# Patient Record
Sex: Male | Born: 1950 | Race: White | Hispanic: No | Smoking: Never smoker
Health system: Southern US, Community
[De-identification: ages and names within clinical notes are randomized; demographics above are authoritative.]

## PROBLEM LIST (undated history)

## (undated) DIAGNOSIS — F32A Depression, unspecified: Secondary | ICD-10-CM

## (undated) DIAGNOSIS — F329 Major depressive disorder, single episode, unspecified: Secondary | ICD-10-CM

## (undated) DIAGNOSIS — E7800 Pure hypercholesterolemia, unspecified: Secondary | ICD-10-CM

## (undated) DIAGNOSIS — I10 Essential (primary) hypertension: Secondary | ICD-10-CM

## (undated) DIAGNOSIS — N400 Enlarged prostate without lower urinary tract symptoms: Secondary | ICD-10-CM

## (undated) DIAGNOSIS — G4730 Sleep apnea, unspecified: Secondary | ICD-10-CM

## (undated) DIAGNOSIS — I480 Paroxysmal atrial fibrillation: Secondary | ICD-10-CM

## (undated) DIAGNOSIS — J45909 Unspecified asthma, uncomplicated: Secondary | ICD-10-CM

## (undated) DIAGNOSIS — Z9884 Bariatric surgery status: Secondary | ICD-10-CM

## (undated) DIAGNOSIS — J449 Chronic obstructive pulmonary disease, unspecified: Secondary | ICD-10-CM

## (undated) DIAGNOSIS — F341 Dysthymic disorder: Secondary | ICD-10-CM

## (undated) DIAGNOSIS — I890 Lymphedema, not elsewhere classified: Secondary | ICD-10-CM

## (undated) DIAGNOSIS — M21371 Foot drop, right foot: Secondary | ICD-10-CM

## (undated) DIAGNOSIS — I509 Heart failure, unspecified: Secondary | ICD-10-CM

## (undated) DIAGNOSIS — Z8659 Personal history of other mental and behavioral disorders: Secondary | ICD-10-CM

## (undated) HISTORY — DX: Lymphedema, not elsewhere classified: I89.0

## (undated) HISTORY — DX: Major depressive disorder, single episode, unspecified: F32.9

## (undated) HISTORY — PX: ROUX-EN-Y GASTRIC BYPASS: SHX1104

## (undated) HISTORY — DX: Dysthymic disorder: F34.1

## (undated) HISTORY — DX: Pure hypercholesterolemia, unspecified: E78.00

## (undated) HISTORY — DX: Sleep apnea, unspecified: G47.30

## (undated) HISTORY — DX: Depression, unspecified: F32.A

## (undated) HISTORY — PX: OTHER SURGICAL HISTORY: SHX169

## (undated) HISTORY — DX: Personal history of other mental and behavioral disorders: Z86.59

---

## 2000-04-02 ENCOUNTER — Inpatient Hospital Stay (HOSPITAL_COMMUNITY): Admission: RE | Admit: 2000-04-02 | Discharge: 2000-04-03 | Payer: Self-pay | Admitting: Otolaryngology

## 2009-03-01 DIAGNOSIS — G4733 Obstructive sleep apnea (adult) (pediatric): Secondary | ICD-10-CM | POA: Insufficient documentation

## 2010-05-26 DIAGNOSIS — Z9884 Bariatric surgery status: Secondary | ICD-10-CM

## 2010-05-26 HISTORY — DX: Bariatric surgery status: Z98.84

## 2013-04-01 DIAGNOSIS — M791 Myalgia, unspecified site: Secondary | ICD-10-CM | POA: Insufficient documentation

## 2013-06-14 DIAGNOSIS — M129 Arthropathy, unspecified: Secondary | ICD-10-CM | POA: Insufficient documentation

## 2013-06-14 DIAGNOSIS — M5416 Radiculopathy, lumbar region: Secondary | ICD-10-CM | POA: Insufficient documentation

## 2014-11-22 ENCOUNTER — Inpatient Hospital Stay (HOSPITAL_COMMUNITY)
Admission: AD | Admit: 2014-11-22 | Discharge: 2014-11-25 | DRG: 444 | Disposition: A | Discharge status: Home / Self Care | Payer: Medicare HMO | Source: Other Acute Inpatient Hospital | Attending: Internal Medicine | Admitting: Internal Medicine

## 2014-11-22 ENCOUNTER — Encounter (HOSPITAL_COMMUNITY): Payer: Self-pay | Admitting: General Practice

## 2014-11-22 DIAGNOSIS — K8050 Calculus of bile duct without cholangitis or cholecystitis without obstruction: Secondary | ICD-10-CM | POA: Diagnosis present

## 2014-11-22 DIAGNOSIS — M549 Dorsalgia, unspecified: Secondary | ICD-10-CM | POA: Diagnosis not present

## 2014-11-22 DIAGNOSIS — G8929 Other chronic pain: Secondary | ICD-10-CM | POA: Diagnosis present

## 2014-11-22 DIAGNOSIS — J45909 Unspecified asthma, uncomplicated: Secondary | ICD-10-CM | POA: Diagnosis not present

## 2014-11-22 DIAGNOSIS — G43909 Migraine, unspecified, not intractable, without status migrainosus: Secondary | ICD-10-CM | POA: Diagnosis not present

## 2014-11-22 DIAGNOSIS — Z9884 Bariatric surgery status: Secondary | ICD-10-CM | POA: Diagnosis not present

## 2014-11-22 DIAGNOSIS — Z87891 Personal history of nicotine dependence: Secondary | ICD-10-CM

## 2014-11-22 DIAGNOSIS — I480 Paroxysmal atrial fibrillation: Secondary | ICD-10-CM | POA: Diagnosis not present

## 2014-11-22 DIAGNOSIS — K859 Acute pancreatitis without necrosis or infection, unspecified: Secondary | ICD-10-CM

## 2014-11-22 DIAGNOSIS — F32A Depression, unspecified: Secondary | ICD-10-CM

## 2014-11-22 DIAGNOSIS — R296 Repeated falls: Secondary | ICD-10-CM | POA: Diagnosis not present

## 2014-11-22 DIAGNOSIS — N400 Enlarged prostate without lower urinary tract symptoms: Secondary | ICD-10-CM | POA: Diagnosis not present

## 2014-11-22 DIAGNOSIS — K851 Biliary acute pancreatitis: Secondary | ICD-10-CM | POA: Diagnosis not present

## 2014-11-22 DIAGNOSIS — Z6841 Body Mass Index (BMI) 40.0 and over, adult: Secondary | ICD-10-CM | POA: Diagnosis not present

## 2014-11-22 DIAGNOSIS — F329 Major depressive disorder, single episode, unspecified: Secondary | ICD-10-CM | POA: Diagnosis present

## 2014-11-22 DIAGNOSIS — I252 Old myocardial infarction: Secondary | ICD-10-CM | POA: Diagnosis not present

## 2014-11-22 DIAGNOSIS — E876 Hypokalemia: Secondary | ICD-10-CM | POA: Diagnosis present

## 2014-11-22 DIAGNOSIS — I10 Essential (primary) hypertension: Secondary | ICD-10-CM | POA: Diagnosis present

## 2014-11-22 DIAGNOSIS — R109 Unspecified abdominal pain: Secondary | ICD-10-CM | POA: Diagnosis present

## 2014-11-22 DIAGNOSIS — F4310 Post-traumatic stress disorder, unspecified: Secondary | ICD-10-CM | POA: Diagnosis not present

## 2014-11-22 DIAGNOSIS — D72829 Elevated white blood cell count, unspecified: Secondary | ICD-10-CM | POA: Diagnosis not present

## 2014-11-22 DIAGNOSIS — M21371 Foot drop, right foot: Secondary | ICD-10-CM

## 2014-11-22 HISTORY — DX: Foot drop, right foot: M21.371

## 2014-11-22 HISTORY — DX: Bariatric surgery status: Z98.84

## 2014-11-22 HISTORY — DX: Depression, unspecified: F32.A

## 2014-11-22 HISTORY — DX: Paroxysmal atrial fibrillation: I48.0

## 2014-11-22 HISTORY — DX: Unspecified asthma, uncomplicated: J45.909

## 2014-11-22 HISTORY — DX: Benign prostatic hyperplasia without lower urinary tract symptoms: N40.0

## 2014-11-22 HISTORY — DX: Major depressive disorder, single episode, unspecified: F32.9

## 2014-11-22 LAB — CBC
HEMATOCRIT: 40.7 % (ref 39.0–52.0)
Hemoglobin: 13.2 g/dL (ref 13.0–17.0)
MCH: 28.6 pg (ref 26.0–34.0)
MCHC: 32.4 g/dL (ref 30.0–36.0)
MCV: 88.3 fL (ref 78.0–100.0)
PLATELETS: 198 10*3/uL (ref 150–400)
RBC: 4.61 MIL/uL (ref 4.22–5.81)
RDW: 14.9 % (ref 11.5–15.5)
WBC: 12.5 10*3/uL — AB (ref 4.0–10.5)

## 2014-11-22 LAB — CREATININE, SERUM
CREATININE: 0.66 mg/dL (ref 0.61–1.24)
GFR calc non Af Amer: >60 mL/min (ref >60)

## 2014-11-22 MED ORDER — BUDESONIDE-FORMOTEROL FUMARATE 160-4.5 MCG/ACT IN AERO: 2.0000 | INHALATION_SPRAY | Freq: Two times a day (BID) | RESPIRATORY_TRACT | Status: DC | PRN

## 2014-11-22 MED ORDER — ESCITALOPRAM OXALATE 10 MG PO TABS
10.0000 mg | ORAL_TABLET | Freq: Three times a day (TID) | ORAL | Status: DC
  Administered 2014-11-22 – 2014-11-25 (×9): 10 mg via ORAL
  Filled 2014-11-22 (×9): qty 1

## 2014-11-22 MED ORDER — TIZANIDINE HCL 4 MG PO TABS
4.0000 mg | ORAL_TABLET | Freq: Four times a day (QID) | ORAL | Status: DC | PRN
  Administered 2014-11-22: 4 mg via ORAL
  Administered 2014-11-23: 8 mg via ORAL
  Administered 2014-11-24: 4 mg via ORAL
  Filled 2014-11-22 (×5): qty 2

## 2014-11-22 MED ORDER — ACETAMINOPHEN 325 MG PO TABS: 650.0000 mg | ORAL_TABLET | Freq: Four times a day (QID) | ORAL | Status: DC | PRN

## 2014-11-22 MED ORDER — DOXAZOSIN MESYLATE 2 MG PO TABS
2.0000 mg | ORAL_TABLET | Freq: Every day | ORAL | Status: DC
  Administered 2014-11-23 – 2014-11-25 (×3): 2 mg via ORAL
  Filled 2014-11-22 (×3): qty 1

## 2014-11-22 MED ORDER — DICYCLOMINE HCL 20 MG PO TABS
20.0000 mg | ORAL_TABLET | Freq: Three times a day (TID) | ORAL | Status: DC
  Administered 2014-11-22 – 2014-11-25 (×11): 20 mg via ORAL
  Filled 2014-11-22 (×17): qty 1

## 2014-11-22 MED ORDER — FLUTICASONE PROPIONATE 50 MCG/ACT NA SUSP: 1.0000 | Freq: Every day | NASAL | Status: DC | PRN

## 2014-11-22 MED ORDER — MORPHINE SULFATE 2 MG/ML IJ SOLN
2.0000 mg | INTRAMUSCULAR | Status: DC | PRN
  Administered 2014-11-22 – 2014-11-24 (×7): 2 mg via INTRAVENOUS
  Filled 2014-11-22 (×7): qty 1

## 2014-11-22 MED ORDER — POTASSIUM CHLORIDE CRYS ER 20 MEQ PO TBCR
40.0000 meq | EXTENDED_RELEASE_TABLET | Freq: Every day | ORAL | Status: DC
  Administered 2014-11-23 – 2014-11-25 (×3): 40 meq via ORAL
  Filled 2014-11-22 (×3): qty 2

## 2014-11-22 MED ORDER — HEPARIN SODIUM (PORCINE) 5000 UNIT/ML IJ SOLN
5000.0000 [IU] | Freq: Three times a day (TID) | INTRAMUSCULAR | Status: DC
  Administered 2014-11-22 – 2014-11-25 (×6): 5000 [IU] via SUBCUTANEOUS
  Filled 2014-11-22 (×7): qty 1

## 2014-11-22 MED ORDER — ONDANSETRON HCL 4 MG/2ML IJ SOLN: 4.0000 mg | Freq: Four times a day (QID) | INTRAMUSCULAR | Status: DC | PRN

## 2014-11-22 MED ORDER — BUPROPION HCL ER (XL) 300 MG PO TB24: 300.0000 mg | ORAL_TABLET | Freq: Every day | ORAL | Status: DC

## 2014-11-22 MED ORDER — LISINOPRIL 20 MG PO TABS
40.0000 mg | ORAL_TABLET | Freq: Every day | ORAL | Status: DC
  Administered 2014-11-22 – 2014-11-25 (×4): 40 mg via ORAL
  Filled 2014-11-22 (×4): qty 2

## 2014-11-22 MED ORDER — BUPROPION HCL ER (XL) 150 MG PO TB24: 150.0000 mg | ORAL_TABLET | Freq: Every day | ORAL | Status: DC

## 2014-11-22 MED ORDER — AMITRIPTYLINE HCL 25 MG PO TABS
25.0000 mg | ORAL_TABLET | Freq: Three times a day (TID) | ORAL | Status: DC
  Administered 2014-11-22 – 2014-11-25 (×9): 25 mg via ORAL
  Filled 2014-11-22 (×9): qty 1

## 2014-11-22 MED ORDER — BUPROPION HCL ER (XL) 300 MG PO TB24
450.0000 mg | ORAL_TABLET | Freq: Every day | ORAL | Status: DC
  Administered 2014-11-22 – 2014-11-23 (×2): 450 mg via ORAL
  Filled 2014-11-22 (×6): qty 1

## 2014-11-22 MED ORDER — POTASSIUM CHLORIDE CRYS ER 20 MEQ PO TBCR
40.0000 meq | EXTENDED_RELEASE_TABLET | Freq: Once | ORAL | Status: AC
  Administered 2014-11-22: 40 meq via ORAL
  Filled 2014-11-22: qty 2

## 2014-11-22 MED ORDER — ACETAMINOPHEN 650 MG RE SUPP: 650.0000 mg | Freq: Four times a day (QID) | RECTAL | Status: DC | PRN

## 2014-11-22 MED ORDER — ALUM & MAG HYDROXIDE-SIMETH 200-200-20 MG/5ML PO SUSP: 30.0000 mL | Freq: Four times a day (QID) | ORAL | Status: DC | PRN

## 2014-11-22 MED ORDER — DOCUSATE SODIUM 100 MG PO CAPS
100.0000 mg | ORAL_CAPSULE | Freq: Two times a day (BID) | ORAL | Status: DC
  Administered 2014-11-22 – 2014-11-25 (×6): 100 mg via ORAL
  Filled 2014-11-22 (×6): qty 1

## 2014-11-22 MED ORDER — ONDANSETRON HCL 4 MG PO TABS: 4.0000 mg | ORAL_TABLET | Freq: Four times a day (QID) | ORAL | Status: DC | PRN

## 2014-11-22 MED ORDER — SODIUM CHLORIDE 0.9 % IV SOLN
INTRAVENOUS | Status: DC
Start: 2014-11-22 — End: 2014-11-25
  Administered 2014-11-22 – 2014-11-25 (×8): via INTRAVENOUS

## 2014-11-22 NOTE — Progress Notes (Signed)
Dr. Butler Denmarkizwan paged that patient has arrive to floor

## 2014-11-22 NOTE — Consult Note (Signed)
Reason for Consult:  Gallstone pancreatitis Referring Physician: Dr. Alease Medina  Brett Cline is an 64 y.o. male.  HPI: 64 y/o seen in [place] with extensive medical history.  He started having pain mid epigastric area yesterday at 10 PM, thhought it was GERD.  It got worse thru the evening and was taken to the ED in [place] this AM.  He  Was  found to have gallstone pancreatitis.  CT scan shows dilated gallbladder, some biliary dilation and stones in the CBD. The CT scan did not show any pancreatic swelling or edema.   Lipase 989, LFT's are elevated with bilirubin 3.5. Alk Phos 608, ALT 115, AST 123  WBC is 14.5 with left shift.  Because of  His Roux -en Y bypass he was referred go [place] and [facility].  Past Medical History  Diagnosis Date  Depression/PTSD     Chronic back pain/2 surgeries   Morbid obesity  BMI 66.5 down after Roux to 41.5   Hx of polysubstance use, quit 24 years ago   Hx of MI 2002 at [facility] per pt report   Paroxysmal a-fib history  -  Currently in SR   Sleep apnea, on CPAP 15 years, better after surgery and weight loss 2012  History of Roux-en-Y gastric bypass 2012 [place]   Migraines  About 2 per week   Hypertension   Asthma   Hx of Polysubstance abuse, none for 24 years   Right foot drop with multiple falls     Past Surgical History  Procedure Laterality Date  . Ent surgery      for sleep apnea  . 2 lumbar spine sugeries    . Roux-en-y gastric bypass  2002    Family History  Problem Relation Age of Onset  . Throat cancer Mother     Social History:  reports that he has never smoked. He does not have any smokeless tobacco history on file. He reports that he does not drink alcohol or use illicit drugs. Tobacco use:  9 years, quit at age 44 ETOH:  Heavy use:  Quit 24 years ago Drugs:  Cocaine, MJ/Hash Widowed, lives with son 45 and daughter 94 Unemployed since age 30 with back and depression/PTSD issues.   Allergies: No Known  Allergies  Medications:  Prior to Admission:  Prescriptions prior to admission  Medication Sig Dispense Refill Last Dose  . amitriptyline (ELAVIL) 25 MG tablet Take 25 mg by mouth 3 (three) times daily.    11/21/2014 at Unknown time  . aspirin-acetaminophen-caffeine (EXCEDRIN MIGRAINE) 250-250-65 MG per tablet Take 2 tablets by mouth every 6 (six) hours as needed for migraine.   Past Week at Unknown time  . buPROPion (WELLBUTRIN XL) 150 MG 24 hr tablet Take 150 mg by mouth daily.    11/21/2014 at Unknown time  . buPROPion (WELLBUTRIN XL) 300 MG 24 hr tablet Take 300 mg by mouth daily.    11/21/2014 at Unknown time  . dicyclomine (BENTYL) 20 MG tablet Take 20 mg by mouth 4 (four) times daily -  before meals and at bedtime.    Past Week at Unknown time  . doxazosin (CARDURA) 2 MG tablet Take 2 mg by mouth daily with breakfast.    11/21/2014 at Unknown time  . escitalopram (LEXAPRO) 10 MG tablet Take 10 mg by mouth 3 (three) times daily.    11/21/2014 at Unknown time  . fluticasone (FLONASE) 50 MCG/ACT nasal spray Place 1 spray into both nostrils daily as  needed for allergies.    Past Week at Unknown time  . HYDROcodone-acetaminophen (NORCO) 10-325 MG per tablet Take 0.5-2 tablets by mouth every 6 (six) hours as needed (pain).    Past Week at Unknown time  . lisinopril (PRINIVIL,ZESTRIL) 40 MG tablet Take 40 mg by mouth daily.    Past Week at Unknown time  . polyvinyl alcohol (LIQUIFILM TEARS) 1.4 % ophthalmic solution Place 1 drop into both eyes every 6 (six) hours as needed for dry eyes.   Past Week at Unknown time  . potassium chloride SA (K-DUR,KLOR-CON) 20 MEQ tablet Take 20 mEq by mouth 2 (two) times daily with a meal.    11/21/2014 at Unknown time  . SYMBICORT 160-4.5 MCG/ACT inhaler Inhale 2 puffs into the lungs 2 (two) times daily as needed (wheezing).    Past Week at Unknown time  . tiZANidine (ZANAFLEX) 4 MG tablet Take 4-8 mg by mouth every 6 (six) hours as needed for muscle spasms.    Past  Week at Unknown time   Scheduled: . amitriptyline  25 mg Oral TID  . buPROPion  450 mg Oral Daily  . dicyclomine  20 mg Oral TID AC & HS  . docusate sodium  100 mg Oral BID  . [START ON 11/23/2014] doxazosin  2 mg Oral Q breakfast  . escitalopram  10 mg Oral TID  . heparin  5,000 Units Subcutaneous 3 times per day  . lisinopril  40 mg Oral Daily  . potassium chloride  40 mEq Oral Once  . [START ON 11/23/2014] potassium chloride SA  40 mEq Oral Daily   Continuous: . sodium chloride 125 mL/hr at 11/22/14 1515   UJW:JXBJYNWGNFAOZ **OR** acetaminophen, alum & mag hydroxide-simeth, budesonide-formoterol, fluticasone, morphine injection, ondansetron **OR** ondansetron (ZOFRAN) IV, tiZANidine Anti-infectives    None      No results found for this or any previous visit (from the past 48 hour(s)).  No results found.  Review of Systems  Constitutional: Positive for chills. Negative for fever and weight loss.  HENT:       Multiple carries, teeth in poor repair.  Eyes: Positive for blurred vision.       Some vision issue; he has not seen an eye doctor for 2 years.  Respiratory: Positive for shortness of breath (DOE) and wheezing (occasional better with inhaler). Negative for hemoptysis.   Cardiovascular: Positive for leg swelling.  Gastrointestinal: Positive for heartburn, nausea, vomiting (once), abdominal pain, diarrhea and constipation.  Genitourinary: Negative.   Musculoskeletal: Positive for back pain (chronic back pain) and falls (right foot drop with multiple falls).  Skin: Negative.   Neurological: Positive for headaches.  Endo/Heme/Allergies: Negative.   Psychiatric/Behavioral: Positive for depression.       PTSD   Blood pressure 162/92, pulse 84, temperature 98.5 F (36.9 C), temperature source Oral, height '5\' 9"'  (1.753 m), SpO2 97 %. Physical Exam  Constitutional: He is oriented to person, place, and time.  Morbidly obese male in bariatric bed, no distress currently.   HENT:  Head: Normocephalic and atraumatic.  Nose: Nose normal.  Eyes: Conjunctivae and EOM are normal. Right eye exhibits no discharge. Left eye exhibits no discharge. No scleral icterus.  Neck: Normal range of motion. Neck supple. No JVD present. No tracheal deviation present. No thyromegaly present.  Cardiovascular: Normal rate, regular rhythm, normal heart sounds and intact distal pulses.   No murmur heard. Respiratory: Effort normal and breath sounds normal. No respiratory distress. He has no wheezes. He has  no rales. He exhibits no tenderness.  GI: Soft. Bowel sounds are normal. He exhibits no distension and no mass. There is no tenderness. There is no rebound and no guarding.  Musculoskeletal: He exhibits edema (trace).  Lymphadenopathy:    He has no cervical adenopathy.  Neurological: He is alert and oriented to person, place, and time. No cranial nerve deficit.  Skin: Skin is warm and dry.  Thick dry skin both lower legs/feet.  Psychiatric: He has a normal mood and affect. His behavior is normal. Judgment and thought content normal.    Assessment/Plan: Gallstone pancreatitis Choledocholithiasis Depression/PTSD Chronic back pain/immobility/multiple falls Morbid obesity Hx of polysubstance use, quit 24 year ago Hx of MI/PAF  Not documented Sleep apnea, with CPAP use 15-20 years Hx of Roux en Y Migraines 2-3 per week Asthma Hypertension Right foot drop with multiple falls DVT:  From our standpoint he can be on Heparin or Lovenox for DVT.  SCD ordered   Plan:  Currently he is pain free so stone may have passed.  We will keep him NPO and recheck labs in AM.  Follow clinical course and see how he does.Ultimately he needs the stone removed, and cholecystectomy.  Jasun Gasparini 11/22/2014, 2:55 PM

## 2014-11-22 NOTE — H&P (Addendum)
[facility] History and Physical  MCCABE GLORIA ZOX:[id number] DOB: 08-27-1950 DOA: 11/22/2014   PCP: Noni Saupe., MD    Chief Complaint: Abdominal pain  HPI: Brett Cline is a 64 y.o. male with morbid obesity status post Roux-en-Y gastric bypass in 2012, chronic back pain, right foot drop with limited mobility, depression, hypertension who presented to [facility] at about 4:00 this morning with abdominal pain. The patient stated that he had been having 4-5 hours of pain. He points to his epigastric area in regards to where the pain was. He states that it radiated out to both sides of his abdomen. He had associated nausea and one episode of vomiting. He had 2 slices of pizza for dinner last night. He does not recall having any fever chills or sweats.  Received morphine in the ER at [facility] and the epigastric pain resolved. While he was having his CT performed, he developed some pain in his back and pain in his right lower quadrant which is still present. CT scan of the abdomen and pelvis reveals gallstone in the common bile duct The EMS initially arrived, he was treated as possible acute coronary syndrome with nitroglycerin and aspirin.  General: The patient denies anorexia, fever, + attempting to lose weight- weight loss of 15-20 lbs in past 3 months Cardiac: Denies chest pain, syncope, palpitations, pedal edema  Respiratory: Denies cough, shortness of breath, wheezing GI: + indigestion/heartburn,+ crampy abdominal pain improves with Bentyl, + nausea, vomiting last night, + loose stools 2 x day GU: Denies hematuria, incontinence, dysuria  Musculoskeletal: Denies arthritis  Skin: Denies suspicious skin lesions Neurologic: Denies numbness, change in vision- has right foot drop Psychiatry: Denies depression or anxiety. Hematologic: + bruising or bleeding  All other systems reviewed and found to be negative.  Past Medical History  Diagnosis  Date  . History of Roux-en-Y gastric bypass 2012  . Paroxysmal a-fib   . Depression   . Asthma   . Right foot drop     Past Surgical History  Procedure Laterality Date  . Ent surgery      for sleep apnea  . 2 lumbar spine sugeries    . Roux-en-y gastric bypass      Social History: does not smoke cigarretes or drink alcohol Lives at home with daughter and son, uses a walker and wheelchair    No Known Allergies  Family history:   Family History  Problem Relation Age of Onset  . Throat cancer Mother       Prior to Admission medications   Medication Sig Start Date End Date Taking? Authorizing Provider  amitriptyline (ELAVIL) 25 MG tablet Take 25 mg by mouth 3 (three) times daily.  10/25/14  Yes Historical Provider, MD  aspirin-acetaminophen-caffeine (EXCEDRIN MIGRAINE) [phone] MG per tablet Take 2 tablets by mouth every 6 (six) hours as needed for migraine.   Yes Historical Provider, MD  buPROPion (WELLBUTRIN XL) 150 MG 24 hr tablet Take 150 mg by mouth daily.  09/22/14  Yes Historical Provider, MD  buPROPion (WELLBUTRIN XL) 300 MG 24 hr tablet Take 300 mg by mouth daily.  09/22/14  Yes Historical Provider, MD  dicyclomine (BENTYL) 20 MG tablet Take 20 mg by mouth 4 (four) times daily -  before meals and at bedtime.  09/22/14  Yes Historical Provider, MD  doxazosin (CARDURA) 2 MG tablet Take 2 mg by mouth daily with breakfast.  09/22/14  Yes Historical Provider, MD  escitalopram (LEXAPRO) 10 MG  tablet Take 10 mg by mouth 3 (three) times daily.  10/25/14  Yes Historical Provider, MD  fluticasone (FLONASE) 50 MCG/ACT nasal spray Place 1 spray into both nostrils daily as needed for allergies.  09/22/14  Yes Historical Provider, MD  HYDROcodone-acetaminophen (NORCO) 10-325 MG per tablet Take 0.5-2 tablets by mouth every 6 (six) hours as needed (pain).  09/26/14  Yes Historical Provider, MD  lisinopril (PRINIVIL,ZESTRIL) 40 MG tablet Take 40 mg by mouth daily.  09/22/14  Yes Historical  Provider, MD  polyvinyl alcohol (LIQUIFILM TEARS) 1.4 % ophthalmic solution Place 1 drop into both eyes every 6 (six) hours as needed for dry eyes.   Yes Historical Provider, MD  potassium chloride SA (K-DUR,KLOR-CON) 20 MEQ tablet Take 20 mEq by mouth 2 (two) times daily with a meal.  10/25/14  Yes Historical Provider, MD  SYMBICORT 160-4.5 MCG/ACT inhaler Inhale 2 puffs into the lungs 2 (two) times daily as needed (wheezing).  10/25/14  Yes Historical Provider, MD  tiZANidine (ZANAFLEX) 4 MG tablet Take 4-8 mg by mouth every 6 (six) hours as needed for muscle spasms.  11/21/14  Yes Historical Provider, MD     Physical Exam: Filed Vitals:   11/22/14 1228 11/22/14 1443  BP: 180/86 162/92  Pulse: 84   Temp: 98.5 F (36.9 C)   TempSrc: Oral   Height: 5\' 9"  (1.753 m)   SpO2: 97%      General: Morbidly obese male laying in bed in no acute distress. HEENT: Normocephalic and Atraumatic, Mucous membranes pink                PERRLA; EOM intact; No scleral icterus,                 Nares: Patent, Oropharynx: Clear, Fair Dentition                 Neck: FROM, no cervical lymphadenopathy, thyromegaly, carotid bruit or JVD;  Breasts: deferred CHEST WALL: No tenderness  CHEST: Normal respiration, clear to auscultation bilaterally  HEART: Regular rate and rhythm; no murmurs rubs or gallops  BACK: No kyphosis or scoliosis; no CVA tenderness  GI: Positive Bowel Sounds, soft, tender in right lower quadrant; no masses, no organomegaly Rectal Exam: deferred MSK: No cyanosis, clubbing, or edema Genitalia: not examined  SKIN:  no rash or ulceration  CNS: Alert and Oriented x 4, Nonfocal exam, CN 2-12 intact  Labs on Admission:  CBC: -WBC 14.5, hemoglobin 14.4, platelets 170  Metabolic panel: Sodium 137, potassium 3.0, BUN 14, creatinine 0.80, total bilirubin 3.5, AST 123, ALT 115, alkaline phosphatase 608, CK 125, troponin less than 0.01  Lipase 989  EKG: Independently reviewed. Normal sinus  rhythm at 86 bpm with T-wave inversion in lead 1 and aVL and flat T waves in V5 and V6  CT scan abdomen and pelvis: Choledocholithiasis with intra-and extrahepatic biliary ductal dilatation, bilateral renal stones without obstruction, bladder wall appears thickened but bladder is under distended prostate is atrophic are absent  Portable chest x-ray: Mild cardiomegaly  Assessment/Plan Principal Problem:   Choledocholithiasis/   Leukocytosis -Epigastric pain is currently resolved-if LFTs are trending down tomorrow, will not need to call GI-however if they are trending up it would mean the stone is still present in the CBD and therefore a GI consult should be requested tomorrow -Pain control with IV morphine -Surgery has been consult and for eventual cholecystectomy -Start Unasyn  Active Problems: Elevated lipase - Lipase is elevated-our radiologist has reviewed CT scan  and states that there is no evidence of acute pancreatitis     HTN (hypertension), benign -Control pain-resume lisinopril   BPH - cont doxazosin    Morbid obesity -Status post post-gastric bypass-    Depression -Continue home medications    Hypokalemia -He takes 20 mEq of potassium twice a day therefore will continue this -Knees and was checked at [facility] and was normal    Chronic back pain -We'll give IV morphine for pain for now, he walks with a walker and have allowed him to ambulate with assistance while he is here    Right foot drop -ambulate with walker with assistance -He is told surgery that he falls a lot-will ask for PT eval    Asthma, chronic -Stable-continue Symbicort and Flonase    Consulted: General surgery  Code Status: Full Code  DVT Prophylaxis: Heparin NOTE : records obtained from PCP to confirm past medical history as patient is not clear on all of his history  Time spent: 50 minutes  Zander Ingham, MD [facility]  If 7PM-7AM, please contact  night-coverage [email or web address] 11/22/2014, 3:13 PM

## 2014-11-23 DIAGNOSIS — F329 Major depressive disorder, single episode, unspecified: Secondary | ICD-10-CM

## 2014-11-23 DIAGNOSIS — K859 Acute pancreatitis, unspecified: Secondary | ICD-10-CM

## 2014-11-23 DIAGNOSIS — M549 Dorsalgia, unspecified: Secondary | ICD-10-CM

## 2014-11-23 DIAGNOSIS — G8929 Other chronic pain: Secondary | ICD-10-CM

## 2014-11-23 LAB — PROTIME-INR
INR: 1.15 (ref 0.00–1.49)
PROTHROMBIN TIME: 14.9 s (ref 11.6–15.2)

## 2014-11-23 LAB — COMPREHENSIVE METABOLIC PANEL
ALK PHOS: 501 U/L — AB (ref 38–126)
ALT: 96 U/L — AB (ref 17–63)
AST: 97 U/L — ABNORMAL HIGH (ref 15–41)
Albumin: 3 g/dL — ABNORMAL LOW (ref 3.5–5.0)
Anion gap: 8 (ref 5–15)
BUN: 12 mg/dL (ref 6–20)
CHLORIDE: 105 mmol/L (ref 101–111)
CO2: 22 mmol/L (ref 22–32)
Calcium: 8.4 mg/dL — ABNORMAL LOW (ref 8.9–10.3)
Creatinine, Ser: 0.73 mg/dL (ref 0.61–1.24)
Glucose, Bld: 96 mg/dL (ref 65–99)
Potassium: 3.7 mmol/L (ref 3.5–5.1)
Sodium: 135 mmol/L (ref 135–145)
Total Bilirubin: 4.9 mg/dL — ABNORMAL HIGH (ref 0.3–1.2)
Total Protein: 6.9 g/dL (ref 6.5–8.1)

## 2014-11-23 LAB — CBC
HCT: 37.2 % — ABNORMAL LOW (ref 39.0–52.0)
Hemoglobin: 12.2 g/dL — ABNORMAL LOW (ref 13.0–17.0)
MCH: 29.5 pg (ref 26.0–34.0)
MCHC: 32.8 g/dL (ref 30.0–36.0)
MCV: 90.1 fL (ref 78.0–100.0)
Platelets: 196 10*3/uL (ref 150–400)
RBC: 4.13 MIL/uL — AB (ref 4.22–5.81)
RDW: 15.3 % (ref 11.5–15.5)
WBC: 7.5 10*3/uL (ref 4.0–10.5)

## 2014-11-23 LAB — LIPASE, BLOOD: Lipase: 11 U/L — ABNORMAL LOW (ref 22–51)

## 2014-11-23 MED ORDER — TAMSULOSIN HCL 0.4 MG PO CAPS
0.4000 mg | ORAL_CAPSULE | Freq: Every day | ORAL | Status: DC
  Administered 2014-11-23 – 2014-11-25 (×3): 0.4 mg via ORAL
  Filled 2014-11-23 (×3): qty 1

## 2014-11-23 MED ORDER — HYDROCODONE-ACETAMINOPHEN 10-325 MG PO TABS
0.5000 | ORAL_TABLET | Freq: Four times a day (QID) | ORAL | Status: DC | PRN
  Administered 2014-11-23 – 2014-11-24 (×3): 1 via ORAL
  Filled 2014-11-23: qty 2
  Filled 2014-11-23 (×2): qty 1

## 2014-11-23 MED ORDER — BUPROPION HCL ER (XL) 150 MG PO TB24
150.0000 mg | ORAL_TABLET | Freq: Every day | ORAL | Status: DC
  Administered 2014-11-23: 150 mg via ORAL
  Filled 2014-11-23 (×2): qty 1

## 2014-11-23 MED ORDER — POLYVINYL ALCOHOL 1.4 % OP SOLN
1.0000 [drp] | Freq: Four times a day (QID) | OPHTHALMIC | Status: DC | PRN
  Filled 2014-11-23: qty 15

## 2014-11-23 MED ORDER — BUPROPION HCL ER (XL) 300 MG PO TB24
300.0000 mg | ORAL_TABLET | Freq: Every day | ORAL | Status: DC
  Administered 2014-11-23: 300 mg via ORAL
  Filled 2014-11-23 (×2): qty 1

## 2014-11-23 NOTE — Progress Notes (Signed)
  Subjective: He feels better complained of lower abdominal pain more than upper abdominal pain.  Objective: Vital signs in last 24 hours: Temp:  [97.5 F (36.4 C)-98.5 F (36.9 C)] 98.1 F (36.7 C) (06/30 0626) Pulse Rate:  [64-84] 67 (06/30 0626) Resp:  [16-18] 16 (06/30 0626) BP: (101-180)/(58-92) 160/79 mmHg (06/30 0626) SpO2:  [96 %-97 %] 96 % (06/30 0626) Weight:  [136.714 kg (301 lb 6.4 oz)-137.485 kg (303 lb 1.6 oz)] 137.485 kg (303 lb 1.6 oz) (06/30 0626) Last BM Date: 11/21/14 NPO 125 urine recorded  Afebrile, BP variable, Bilirubin is up to 4.9 from 3.5 yesterday, Alk phos, ALT and AST are better. Lipase down to 11.  WBC is normal.   Intake/Output from previous day: 06/29 0701 - 06/30 0700 In: 437.5 [I.V.:437.5] Out: 125 [Urine:125] Intake/Output this shift: Total I/O In: -  Out: 850 [Urine:850]  General appearance: alert, cooperative and no distress GI: soft, non-tender; bowel sounds normal; no masses,  no organomegaly  Lab Results:   Recent Labs  11/22/14 1543 11/23/14 0507  WBC 12.5* 7.5  HGB 13.2 12.2*  HCT 40.7 37.2*  PLT 198 196    BMET  Recent Labs  11/22/14 1543 11/23/14 0507  NA  --  135  K  --  3.7  CL  --  105  CO2  --  22  GLUCOSE  --  96  BUN  --  12  CREATININE 0.66 0.73  CALCIUM  --  8.4*   PT/INR  Recent Labs  11/23/14 0507  LABPROT 14.9  INR 1.15     Recent Labs Lab 11/23/14 0507  AST 97*  ALT 96*  ALKPHOS 501*  BILITOT 4.9*  PROT 6.9  ALBUMIN 3.0*     Lipase     Component Value Date/Time   LIPASE 11* 11/23/2014 0507     Studies/Results: No results found.  Medications: . amitriptyline  25 mg Oral TID  . buPROPion  450 mg Oral Daily  . dicyclomine  20 mg Oral TID AC & HS  . docusate sodium  100 mg Oral BID  . doxazosin  2 mg Oral Q breakfast  . escitalopram  10 mg Oral TID  . heparin  5,000 Units Subcutaneous 3 times per day  . lisinopril  40 mg Oral Daily  . potassium chloride SA  40 mEq  Oral Daily    Assessment/Plan Gallstone pancreatitis Choledocholithiasis Depression/PTSD Chronic back pain/immobility/multiple falls Morbid obesity Body mass index is 44.74  Hx of polysubstance use, quit 24 year ago Hx of MI/PAF Not documented Sleep apnea, with CPAP use 15-20 years, better after weight loss and surgery Hx of Roux en Y Migraines 2-3 per week Asthma Hypertension Right foot drop with multiple falls DVT: From our standpoint he can be on Heparin or Lovenox for DVT. SCD ordered   Plan:  Stable and improving pancreatitis better.  Reviewed with Dr. Marcello Moores, will need to get some help with Bariatric surgeons, so we cannot do today, but will do before he goes home.  I will order clears for him now.    LOS: 1 day    Celestia Duva 11/23/2014

## 2014-11-23 NOTE — Progress Notes (Signed)
[facility] Progress Note   Brett CreamerWilliam R Torrens Jr. ZOX:[id number] DOB: 07/04/1950 DOA: 11/22/2014 PCP: Noni SaupeEDDING II,JOHN F., MD  Brief narrative: Brett CreamerWilliam R Offer Jr. is a 64 y.o. male with morbid obesity status post Roux-en-Y gastric bypass in 2012chronic back pain, right foot drop with limited mobility, depression, hypertension who [facility] in the [facility] after eating 2 slices of pizza the night before with epigastric pain. His found to have choledocholithiasis and referred for admission.   Subjective: Has not had recurrence of epigastric pain after receiving morphine in the ER.  Assessment/Plan: Principal Problem:   Choledocholithiasis -Appears to have passed the stone-no recurrent pain-LFTs improving -Surgeon's plan on performing surgery-due to the fact that it will be a complicated surgery because he has a Roux-en-Y, we'll need to gastric surgeons-surgery will be either tomorrow or Saturday  Active Problems:   Pancreatitis, acute -Lipase normalized-placed on clear liquids by surgery  Leukocytosis -likey stress response-improving  BPH - cont doxazosin   Morbid obesity -Status post post-gastric bypass-   Depression -Continue home medications   Hypokalemia -Replaced -He takes 20 mEq of potassium twice a day therefore will continue at a dose of 40 mEq once a day   Chronic back pain -Resume when necessary hydrocodone-also has when necessary IV morphine if pain is severe he walks with a walker and have allowed him to ambulate with assistance while he is here   Right foot drop -ambulate with walker with assistance -The patient falls a lot- will ask for PT eval   Asthma, chronic -Stable-continue Symbicort and Flonase  Code Status: Full code Family Communication:  Disposition Plan: Await surgery DVT prophylaxis: Heparin Consultants: Surgery Procedures:  Antibiotics: Anti-infectives    None      Objective: Filed  Weights   11/22/14 1228 11/23/14 0626  Weight: 136.714 kg (301 lb 6.4 oz) 137.485 kg (303 lb 1.6 oz)    Intake/Output Summary (Last 24 hours) at 11/23/14 1357 Last data filed at 11/23/14 0830  Gross per 24 hour  Intake  437.5 ml  Output    975 ml  Net -537.5 ml     Vitals Filed Vitals:   11/22/14 1228 11/22/14 1443 11/22/14 2204 11/23/14 0626  BP: 180/86 162/92 101/58 160/79  Pulse: 84  64 67  Temp: 98.5 F (36.9 C)  97.5 F (36.4 C) 98.1 F (36.7 C)  TempSrc: Oral  Axillary Oral  Resp: 18  16 16   Height: 5\' 9"  (1.753 m)     Weight: 136.714 kg (301 lb 6.4 oz)   137.485 kg (303 lb 1.6 oz)  SpO2: 97%  97% 96%    Exam:  General:  Pt is alert, not in acute distress  HEENT: No icterus, No thrush, oral mucosa moist  Cardiovascular: regular rate and rhythm, S1/S2 No murmur  Respiratory: clear to auscultation bilaterally   Abdomen: Soft, +Bowel sounds, non tender, non distended, no guarding  MSK: No LE edema, cyanosis or clubbing  Data Reviewed: Basic Metabolic Panel:  Recent Labs Lab 11/22/14 1543 11/23/14 0507  NA  --  135  K  --  3.7  CL  --  105  CO2  --  22  GLUCOSE  --  96  BUN  --  12  CREATININE 0.66 0.73  CALCIUM  --  8.4*   Liver Function Tests:  Recent Labs Lab 11/23/14 0507  AST 97*  ALT 96*  ALKPHOS 501*  BILITOT 4.9*  PROT 6.9  ALBUMIN 3.0*  Recent Labs Lab 11/23/14 0507  LIPASE 11*   No results for input(s): AMMONIA in the last 168 hours. CBC:  Recent Labs Lab 11/22/14 1543 11/23/14 0507  WBC 12.5* 7.5  HGB 13.2 12.2*  HCT 40.7 37.2*  MCV 88.3 90.1  PLT 198 196   Cardiac Enzymes: No results for input(s): CKTOTAL, CKMB, CKMBINDEX, TROPONINI in the last 168 hours. BNP (last 3 results) No results for input(s): BNP in the last 8760 hours.  ProBNP (last 3 results) No results for input(s): PROBNP in the last 8760 hours.  CBG: No results for input(s): GLUCAP in the last 168 hours.  No results found for this or any  previous visit (from the past 240 hour(s)).   Studies: No results found.  Scheduled Meds:  Scheduled Meds: . amitriptyline  25 mg Oral TID  . buPROPion  450 mg Oral Daily  . dicyclomine  20 mg Oral TID AC & HS  . docusate sodium  100 mg Oral BID  . doxazosin  2 mg Oral Q breakfast  . escitalopram  10 mg Oral TID  . heparin  5,000 Units Subcutaneous 3 times per day  . lisinopril  40 mg Oral Daily  . potassium chloride SA  40 mEq Oral Daily  . tamsulosin  0.4 mg Oral Daily   Continuous Infusions: . sodium chloride 125 mL/hr at 11/23/14 1301    Time spent on care of this patient: 35 min   Jackye Dever, MD 11/23/2014, 1:57 PM  LOS: 1 day   [facility] Office  [phone] Pager - Text Page per [email or web address] If 7PM-7AM, please contact night-coverage [email or web address]

## 2014-11-23 NOTE — Progress Notes (Signed)
Pt states he has not voided since Monday night.  Bladder scan shows 527ml.  Orders obtained from MD to I&O cath.  850ml out of cath.

## 2014-11-24 DIAGNOSIS — I10 Essential (primary) hypertension: Secondary | ICD-10-CM

## 2014-11-24 LAB — CBC
HCT: 39.5 % (ref 39.0–52.0)
Hemoglobin: 12.6 g/dL — ABNORMAL LOW (ref 13.0–17.0)
MCH: 28.8 pg (ref 26.0–34.0)
MCHC: 31.9 g/dL (ref 30.0–36.0)
MCV: 90.2 fL (ref 78.0–100.0)
PLATELETS: 172 10*3/uL (ref 150–400)
RBC: 4.38 MIL/uL (ref 4.22–5.81)
RDW: 15.3 % (ref 11.5–15.5)
WBC: 6.6 10*3/uL (ref 4.0–10.5)

## 2014-11-24 LAB — COMPREHENSIVE METABOLIC PANEL
ALK PHOS: 491 U/L — AB (ref 38–126)
ALT: 90 U/L — ABNORMAL HIGH (ref 17–63)
AST: 85 U/L — ABNORMAL HIGH (ref 15–41)
Albumin: 3 g/dL — ABNORMAL LOW (ref 3.5–5.0)
Anion gap: 8 (ref 5–15)
BUN: 8 mg/dL (ref 6–20)
CO2: 23 mmol/L (ref 22–32)
Calcium: 8.6 mg/dL — ABNORMAL LOW (ref 8.9–10.3)
Chloride: 105 mmol/L (ref 101–111)
Creatinine, Ser: 0.74 mg/dL (ref 0.61–1.24)
GFR calc Af Amer: >60 mL/min (ref >60)
GFR calc non Af Amer: >60 mL/min (ref >60)
GLUCOSE: 87 mg/dL (ref 65–99)
POTASSIUM: 3.9 mmol/L (ref 3.5–5.1)
Sodium: 136 mmol/L (ref 135–145)
Total Bilirubin: 3.6 mg/dL — ABNORMAL HIGH (ref 0.3–1.2)
Total Protein: 7 g/dL (ref 6.5–8.1)

## 2014-11-24 LAB — MRSA PCR SCREENING: MRSA BY PCR: NEGATIVE

## 2014-11-24 MED ORDER — BUPROPION HCL ER (XL) 300 MG PO TB24
450.0000 mg | ORAL_TABLET | Freq: Every day | ORAL | Status: DC
  Filled 2014-11-24 (×2): qty 1

## 2014-11-24 NOTE — Progress Notes (Signed)
[facility] Progress Note   [name] WJX:[id number] DOB: 20-Jul-1950 DOA: 11/22/2014 PCP: Noni Saupe., MD  Brief narrative: Brett Potempa. is a 64 y.o. male with morbid obesity status post Roux-en-Y gastric bypass in 2012chronic back pain, right foot drop with limited mobility, depression, hypertension who presented to [facility] at 4 in the morning after eating 2 slices of pizza the night before with epigastric pain. His found to have choledocholithiasis and referred for admission.   Subjective: Continues to be pain free.   Assessment/Plan: Principal Problem:   Choledocholithiasis -Appears to have passed the stone-no recurrent pain-LFTs improving -due to the fact that it will be a complicated surgery because he has a Roux-en-Y, we'll need to gastric surgeons- plan per surgery is to start to feed and follow LFTs  Active Problems:   Pancreatitis, acute -Lipase normalized-   Leukocytosis -likey stress response-improving  BPH - cont doxazosin   Morbid obesity -Status post post-gastric bypass-   Depression -Continue home medications   Hypokalemia -Replaced -He takes 20 mEq of potassium twice a day therefore will continue at a dose of 40 mEq once a day   Chronic back pain -Resumed when necessary hydrocodone-also has when necessary IV morphine if pain is severe he walks with a walker and have allowed him to ambulate with assistance while he is here   Right foot drop -ambulate with walker with assistance -The patient falls a lot- will ask for PT eval   Asthma, chronic -Stable-continue Symbicort and Flonase  Code Status: Full code Family Communication:  Disposition Plan: Await surgery DVT prophylaxis: Heparin Consultants: Surgery Procedures:  Antibiotics: Anti-infectives    None      Objective: Filed Weights   11/22/14 1228 11/23/14 0626  Weight: 136.714 kg (301 lb 6.4 oz) 137.485 kg (303 lb 1.6 oz)     Intake/Output Summary (Last 24 hours) at 11/24/14 1416 Last data filed at 11/24/14 1107  Gross per 24 hour  Intake   2240 ml  Output   3350 ml  Net  -1110 ml     Vitals Filed Vitals:   11/23/14 2140 11/24/14 0523 11/24/14 0737 11/24/14 1403  BP: 150/75 175/93 182/96 164/73  Pulse: 69 69  72  Temp: 98.5 F (36.9 C) 97.7 F (36.5 C)  98.4 F (36.9 C)  TempSrc: Oral Oral  Oral  Resp: Height:      Weight:      SpO2: 100% 99%  100%    Exam:  General:  Pt is alert, not in acute distress  HEENT: No icterus, No thrush, oral mucosa moist  Cardiovascular: regular rate and rhythm, S1/S2 No murmur  Respiratory: clear to auscultation bilaterally   Abdomen: Soft, +Bowel sounds, non tender, non distended, no guarding  MSK: No LE edema, cyanosis or clubbing  Data Reviewed: Basic Metabolic Panel:  Recent Labs Lab 11/22/14 1543 11/23/14 0507 11/24/14 0510  NA  --  135 136  K  --  3.7 3.9  CL  --  105 105  CO2  --  22 23  GLUCOSE  --  96 87  BUN  --  12 8  CREATININE 0.66 0.73 0.74  CALCIUM  --  8.4* 8.6*   Liver Function Tests:  Recent Labs Lab 11/23/14 0507 11/24/14 0510  AST 97* 85*  ALT 96* 90*  ALKPHOS 501* 491*  BILITOT 4.9* 3.6*  PROT 6.9 7.0  ALBUMIN 3.0* 3.0*    Recent Labs  Lab 11/23/14 0507  LIPASE 11*   No results for input(s): AMMONIA in the last 168 hours. CBC:  Recent Labs Lab 11/22/14 1543 11/23/14 0507 11/24/14 0510  WBC 12.5* 7.5 6.6  HGB 13.2 12.2* 12.6*  HCT 40.7 37.2* 39.5  MCV 88.3 90.1 90.2  PLT 198 196 172   Cardiac Enzymes: No results for input(s): CKTOTAL, CKMB, CKMBINDEX, TROPONINI in the last 168 hours. BNP (last 3 results) No results for input(s): BNP in the last 8760 hours.  ProBNP (last 3 results) No results for input(s): PROBNP in the last 8760 hours.  CBG: No results for input(s): GLUCAP in the last 168 hours.  Recent Results (from the past 240 hour(s))  MRSA PCR Screening     Status:  None   Collection Time: 11/24/14 12:40 AM  Result Value Ref Range Status   MRSA by PCR NEGATIVE NEGATIVE Final    Comment:        The GeneXpert MRSA Assay (FDA approved for NASAL specimens only), is one component of a comprehensive MRSA colonization surveillance program. It is not intended to diagnose MRSA infection nor to guide or monitor treatment for MRSA infections.      Studies: No results found.  Scheduled Meds:  Scheduled Meds: . amitriptyline  25 mg Oral TID  . [START ON 11/25/2014] buPROPion  450 mg Oral Daily  . dicyclomine  20 mg Oral TID AC & HS  . docusate sodium  100 mg Oral BID  . doxazosin  2 mg Oral Q breakfast  . escitalopram  10 mg Oral TID  . heparin  5,000 Units Subcutaneous 3 times per day  . lisinopril  40 mg Oral Daily  . potassium chloride SA  40 mEq Oral Daily  . tamsulosin  0.4 mg Oral Daily   Continuous Infusions: . sodium chloride 125 mL/hr at 11/24/14 [id number]    Time spent on care of this patient: 35 min   Brett Gurka, MD 11/24/2014, 2:16 PM  LOS: 2 days   [facility] Office  [phone] Pager - Text Page per [email or web address] If 7PM-7AM, please contact night-coverage [email or web address]

## 2014-11-24 NOTE — Progress Notes (Signed)
Patient ID: Sharyn CreamerWilliam R Henken Cline., male   DOB: 04/06/1951, 64 y.o.   MRN: [id number]    Subjective: Pt feels well today.  No pain  Objective: Vital signs in last 24 hours: Temp:  [97.7 F (36.5 C)-98.5 F (36.9 C)] 97.7 F (36.5 C) (07/01 0523) Pulse Rate:  [67-69] 69 (07/01 0523) Resp:  [16-18] 18 (07/01 0523) BP: (115-182)/(56-96) 182/96 mmHg (07/01 0737) SpO2:  [99 %-100 %] 99 % (07/01 0523) Last BM Date: 11/21/14  Intake/Output from previous day: 06/30 0701 - 07/01 0700 In: 5126.3 [P.O.:720; I.V.:4406.3] Out: 2600 [Urine:2600] Intake/Output this shift:    PE: Abd: soft, NT, ND, +BS, obese Heart: regular Lungs: CTAB  Lab Results:   Recent Labs  11/23/14 0507 11/24/14 0510  WBC 7.5 6.6  HGB 12.2* 12.6*  HCT 37.2* 39.5  PLT 196 172   BMET  Recent Labs  11/23/14 0507 11/24/14 0510  NA 135 136  K 3.7 3.9  CL 105 105  CO2 22 23  GLUCOSE 96 87  BUN 12 8  CREATININE 0.73 0.74  CALCIUM 8.4* 8.6*   PT/INR  Recent Labs  11/23/14 0507  LABPROT 14.9  INR 1.15   CMP     Component Value Date/Time   NA 136 11/24/2014 0510   K 3.9 11/24/2014 0510   CL 105 11/24/2014 0510   CO2 23 11/24/2014 0510   GLUCOSE 87 11/24/2014 0510   BUN 8 11/24/2014 0510   CREATININE 0.74 11/24/2014 0510   CALCIUM 8.6* 11/24/2014 0510   PROT 7.0 11/24/2014 0510   ALBUMIN 3.0* 11/24/2014 0510   AST 85* 11/24/2014 0510   ALT 90* 11/24/2014 0510   ALKPHOS 491* 11/24/2014 0510   BILITOT 3.6* 11/24/2014 0510   GFRNONAA >60 11/24/2014 0510   GFRAA >60 11/24/2014 0510   Lipase     Component Value Date/Time   LIPASE 11* 11/23/2014 0507       Studies/Results: No results found.  Anti-infectives: Anti-infectives    None       Assessment/Plan  1. Choledocholithiasis, cholelithiasis -LFTs are trending down.  ? If he has passed a stone. -difficult situation given his roux-en-y.  Will check labs in am, if continuing to trend down and remains pain-free and can  tolerate a low fat diet, then consider DC home tomorrow and follow up with bariatric surgeon/Dr. Maisie Fushomas as an outpatient to set up a combined case with GI for possible intra-op ERCP through gastric remnant if needed.   -will ask GI to see to be on board to get their opinion as well and to also set up possible outpatient follow up and arrangements for future surgery.   LOS: 2 days    Brett Cline 11/24/2014, 9:43 AM Pager: [phone]

## 2014-11-25 LAB — COMPREHENSIVE METABOLIC PANEL WITH GFR
ALT: 67 U/L — ABNORMAL HIGH (ref 17–63)
AST: 53 U/L — ABNORMAL HIGH (ref 15–41)
Albumin: 2.8 g/dL — ABNORMAL LOW (ref 3.5–5.0)
Alkaline Phosphatase: 479 U/L — ABNORMAL HIGH (ref 38–126)
Anion gap: 6 (ref 5–15)
BUN: 7 mg/dL (ref 6–20)
CO2: 25 mmol/L (ref 22–32)
Calcium: 8.5 mg/dL — ABNORMAL LOW (ref 8.9–10.3)
Chloride: 106 mmol/L (ref 101–111)
Creatinine, Ser: 0.74 mg/dL (ref 0.61–1.24)
GFR calc Af Amer: >60 mL/min
GFR calc non Af Amer: >60 mL/min
Glucose, Bld: 89 mg/dL (ref 65–99)
Potassium: 4.2 mmol/L (ref 3.5–5.1)
Sodium: 137 mmol/L (ref 135–145)
Total Bilirubin: 1.9 mg/dL — ABNORMAL HIGH (ref 0.3–1.2)
Total Protein: 6.9 g/dL (ref 6.5–8.1)

## 2014-11-25 MED ORDER — HYDRALAZINE HCL 20 MG/ML IJ SOLN
10.0000 mg | Freq: Four times a day (QID) | INTRAMUSCULAR | Status: DC | PRN
  Administered 2014-11-25: 10 mg via INTRAVENOUS
  Filled 2014-11-25: qty 1

## 2014-11-25 NOTE — Progress Notes (Signed)
Patient ID: Sharyn CreamerWilliam R Nygaard Jr., male   DOB: 03/02/1951, 64 y.o.   MRN: [id number] Patient seen before discharge and arrangements made to call [name] office and I will try to see this week to schedule CBD intervention.    Matt B. Daphine DeutscherMartin, MD, [facility] Louisiana[facility]ACS  [facility] [phone] beeper [phone]  11/25/2014 10:47 AM

## 2014-11-25 NOTE — Discharge Summary (Signed)
Physician Discharge Summary  [name] [id number]:[id number] DOB: 1950/08/27 DOA: 11/22/2014  PCP: Noni Saupe., MD  Admit date: 11/22/2014 Discharge date: 11/25/2014  Time spent: 50 minutes  Recommendations for Outpatient Follow-up:  1. Follow-up with surgery as outpatient  Discharge Condition: Stable  Diet recommendation: Low fat and heart healthy  Discharge Diagnoses:  Principal Problem:   Choledocholithiasis Active Problems:   Pancreatitis, acute   Leukocytosis   HTN (hypertension), benign   Morbid obesity   Depression   Hypokalemia   Chronic back pain   Right foot drop   Asthma, chronic   History of present illness:  Brett Cline. is a 64 y.o. male with morbid obesity status post Roux-en-Y gastric bypass in 2012chronic back pain, right foot drop with limited mobility, depression, hypertension who presented to [facility] at 4 in the morning after eating 2 slices of pizza the night before with epigastric pain. His found to have choledocholithiasis and referred for admission.  Hospital Course:  Principal Problem:  Choledocholithiasis -Appears to have passed the stone-no recurrent pain-LFTs improving-tolerating solid food without any rise in LFTs-recommended not to eat any fatty food -due to the fact that it will be a complicated surgery because he has a Roux-en-Y-general surgery will see him as an outpatient and plan for procedure  Active Problems:  Pancreatitis, acute -Lipase normalized-   Leukocytosis -likey stress response-improving  BPH - cont doxazosin   Morbid obesity -Status post post-gastric bypass-   Depression -Continue home medications   Hypokalemia -Replaced -He takes 20 mEq of potassium twice a day at home will be continued   Chronic back pain -Continue hydrocodone   Right foot drop -ambulate with walker with assistance   Asthma, chronic -Stable-continue Symbicort and  Flonase  Consultations:  Enteral surgery  Discharge Exam: Filed Weights   11/22/14 1228 11/23/14 0626 11/25/14 0556  Weight: 136.714 kg (301 lb 6.4 oz) 137.485 kg (303 lb 1.6 oz) 138.4 kg (305 lb 1.9 oz)   Filed Vitals:   11/25/14 0556  BP: 189/104  Pulse: 73  Temp: 98 F (36.7 C)  Resp: 22    General: AAO x 3, no distress Cardiovascular: RRR, no murmurs  Respiratory: clear to auscultation bilaterally GI: soft, non-tender, non-distended, bowel sound positive  Discharge Instructions You were cared for by a hospitalist during your hospital stay. If you have any questions about your discharge medications or the care you received while you were in the hospital after you are discharged, you can call the unit and asked to speak with the hospitalist on call if the hospitalist that took care of you is not available. Once you are discharged, your primary care physician will handle any further medical issues. Please note that NO REFILLS for any discharge medications will be authorized once you are discharged, as it is imperative that you return to your primary care physician (or establish a relationship with a primary care physician if you do not have one) for your aftercare needs so that they can reassess your need for medications and monitor your lab values.  Discharge Instructions    Diet - low sodium heart healthy    Complete by:  As directed      Increase activity slowly    Complete by:  As directed             Medication List    TAKE these medications        amitriptyline 25 MG tablet  Commonly known as:  ELAVIL  Take 25 mg by mouth 3 (three) times daily.     aspirin-acetaminophen-caffeine 250-250-65 MG per tablet  Commonly known as:  EXCEDRIN MIGRAINE  Take 2 tablets by mouth every 6 (six) hours as needed for migraine.     buPROPion 300 MG 24 hr tablet  Commonly known as:  WELLBUTRIN XL  Take 300 mg by mouth daily.     buPROPion 150 MG 24 hr tablet  Commonly known  as:  WELLBUTRIN XL  Take 150 mg by mouth daily.     dicyclomine 20 MG tablet  Commonly known as:  BENTYL  Take 20 mg by mouth 4 (four) times daily -  before meals and at bedtime.     doxazosin 2 MG tablet  Commonly known as:  CARDURA  Take 2 mg by mouth daily with breakfast.     escitalopram 10 MG tablet  Commonly known as:  LEXAPRO  Take 10 mg by mouth 3 (three) times daily.     fluticasone 50 MCG/ACT nasal spray  Commonly known as:  FLONASE  Place 1 spray into both nostrils daily as needed for allergies.     HYDROcodone-acetaminophen 10-325 MG per tablet  Commonly known as:  NORCO  Take 0.5-2 tablets by mouth every 6 (six) hours as needed (pain).     lisinopril 40 MG tablet  Commonly known as:  PRINIVIL,ZESTRIL  Take 40 mg by mouth daily.     polyvinyl alcohol 1.4 % ophthalmic solution  Commonly known as:  LIQUIFILM TEARS  Place 1 drop into both eyes every 6 (six) hours as needed for dry eyes.     potassium chloride SA 20 MEQ tablet  Commonly known as:  K-DUR,KLOR-CON  Take 20 mEq by mouth 2 (two) times daily with a meal.     SYMBICORT 160-4.5 MCG/ACT inhaler  Generic drug:  budesonide-formoterol  Inhale 2 puffs into the lungs 2 (two) times daily as needed (wheezing).     tiZANidine 4 MG tablet  Commonly known as:  ZANAFLEX  Take 4-8 mg by mouth every 6 (six) hours as needed for muscle spasms.       No Known Allergies    The results of significant diagnostics from this hospitalization (including imaging, microbiology, ancillary and laboratory) are listed below for reference.    Significant Diagnostic Studies: No results found.  Microbiology: Recent Results (from the past 240 hour(s))  MRSA PCR Screening     Status: None   Collection Time: 11/24/14 12:40 AM  Result Value Ref Range Status   MRSA by PCR NEGATIVE NEGATIVE Final    Comment:        The GeneXpert MRSA Assay (FDA approved for NASAL specimens only), is one component of a comprehensive MRSA  colonization surveillance program. It is not intended to diagnose MRSA infection nor to guide or monitor treatment for MRSA infections.      Labs: Basic Metabolic Panel:  Recent Labs Lab 11/22/14 1543 11/23/14 0507 11/24/14 0510 11/25/14 0541  NA  --  135 136 137  K  --  3.7 3.9 4.2  CL  --  105 105 106  CO2  --  22 23 25   GLUCOSE  --  96 87 89  BUN  --  12 8 7   CREATININE 0.66 0.73 0.74 0.74  CALCIUM  --  8.4* 8.6* 8.5*   Liver Function Tests:  Recent Labs Lab 11/23/14 0507 11/24/14 0510 11/25/14 0541  AST 97* 85* 53*  ALT 96* 90* 67*  ALKPHOS 501* 491* 479*  BILITOT 4.9* 3.6* 1.9*  PROT 6.9 7.0 6.9  ALBUMIN 3.0* 3.0* 2.8*    Recent Labs Lab 11/23/14 0507  LIPASE 11*   No results for input(s): AMMONIA in the last 168 hours. CBC:  Recent Labs Lab 11/22/14 1543 11/23/14 0507 11/24/14 0510  WBC 12.5* 7.5 6.6  HGB 13.2 12.2* 12.6*  HCT 40.7 37.2* 39.5  MCV 88.3 90.1 90.2  PLT 198 196 172   Cardiac Enzymes: No results for input(s): CKTOTAL, CKMB, CKMBINDEX, TROPONINI in the last 168 hours. BNP: BNP (last 3 results) No results for input(s): BNP in the last 8760 hours.  ProBNP (last 3 results) No results for input(s): PROBNP in the last 8760 hours.  CBG: No results for input(s): GLUCAP in the last 168 hours.     SignedCalvert Cantor:  Celester Lech, MD [facility] 11/25/2014, 10:45 AM

## 2014-11-25 NOTE — Progress Notes (Signed)
Pt BP 189/104 adm hydralazine 10 mg IV.  Rechecked BP 180/96.  MD notified.  Will continue to monitor.

## 2014-12-20 ENCOUNTER — Encounter (HOSPITAL_COMMUNITY): Payer: Self-pay | Admitting: *Deleted

## 2014-12-20 ENCOUNTER — Inpatient Hospital Stay (HOSPITAL_COMMUNITY)
Admission: AD | Admit: 2014-12-20 | Discharge: 2014-12-29 | DRG: 853 | Disposition: A | Discharge status: Home Health | Payer: Medicare HMO | Source: Other Acute Inpatient Hospital | Attending: General Surgery | Admitting: General Surgery

## 2014-12-20 DIAGNOSIS — I10 Essential (primary) hypertension: Secondary | ICD-10-CM | POA: Diagnosis present

## 2014-12-20 DIAGNOSIS — G43909 Migraine, unspecified, not intractable, without status migrainosus: Secondary | ICD-10-CM | POA: Diagnosis present

## 2014-12-20 DIAGNOSIS — K8064 Calculus of gallbladder and bile duct with chronic cholecystitis without obstruction: Secondary | ICD-10-CM | POA: Diagnosis present

## 2014-12-20 DIAGNOSIS — M549 Dorsalgia, unspecified: Secondary | ICD-10-CM | POA: Diagnosis present

## 2014-12-20 DIAGNOSIS — I252 Old myocardial infarction: Secondary | ICD-10-CM | POA: Diagnosis not present

## 2014-12-20 DIAGNOSIS — Z9181 History of falling: Secondary | ICD-10-CM | POA: Diagnosis not present

## 2014-12-20 DIAGNOSIS — J438 Other emphysema: Secondary | ICD-10-CM | POA: Diagnosis present

## 2014-12-20 DIAGNOSIS — Z7982 Long term (current) use of aspirin: Secondary | ICD-10-CM | POA: Diagnosis not present

## 2014-12-20 DIAGNOSIS — F329 Major depressive disorder, single episode, unspecified: Secondary | ICD-10-CM | POA: Diagnosis present

## 2014-12-20 DIAGNOSIS — G8929 Other chronic pain: Secondary | ICD-10-CM | POA: Diagnosis present

## 2014-12-20 DIAGNOSIS — J45909 Unspecified asthma, uncomplicated: Secondary | ICD-10-CM | POA: Diagnosis present

## 2014-12-20 DIAGNOSIS — Z808 Family history of malignant neoplasm of other organs or systems: Secondary | ICD-10-CM

## 2014-12-20 DIAGNOSIS — N400 Enlarged prostate without lower urinary tract symptoms: Secondary | ICD-10-CM | POA: Diagnosis present

## 2014-12-20 DIAGNOSIS — R1011 Right upper quadrant pain: Secondary | ICD-10-CM | POA: Diagnosis present

## 2014-12-20 DIAGNOSIS — G4733 Obstructive sleep apnea (adult) (pediatric): Secondary | ICD-10-CM | POA: Diagnosis present

## 2014-12-20 DIAGNOSIS — N210 Calculus in bladder: Secondary | ICD-10-CM | POA: Diagnosis not present

## 2014-12-20 DIAGNOSIS — N200 Calculus of kidney: Secondary | ICD-10-CM | POA: Diagnosis present

## 2014-12-20 DIAGNOSIS — Z23 Encounter for immunization: Secondary | ICD-10-CM

## 2014-12-20 DIAGNOSIS — A419 Sepsis, unspecified organism: Secondary | ICD-10-CM | POA: Diagnosis present

## 2014-12-20 DIAGNOSIS — K760 Fatty (change of) liver, not elsewhere classified: Secondary | ICD-10-CM | POA: Diagnosis not present

## 2014-12-20 DIAGNOSIS — R296 Repeated falls: Secondary | ICD-10-CM | POA: Diagnosis not present

## 2014-12-20 DIAGNOSIS — F32A Depression, unspecified: Secondary | ICD-10-CM | POA: Diagnosis present

## 2014-12-20 DIAGNOSIS — N390 Urinary tract infection, site not specified: Secondary | ICD-10-CM | POA: Diagnosis present

## 2014-12-20 DIAGNOSIS — K8050 Calculus of bile duct without cholangitis or cholecystitis without obstruction: Secondary | ICD-10-CM

## 2014-12-20 DIAGNOSIS — I4510 Unspecified right bundle-branch block: Secondary | ICD-10-CM | POA: Diagnosis not present

## 2014-12-20 DIAGNOSIS — I2510 Atherosclerotic heart disease of native coronary artery without angina pectoris: Secondary | ICD-10-CM | POA: Diagnosis not present

## 2014-12-20 DIAGNOSIS — K859 Acute pancreatitis without necrosis or infection, unspecified: Secondary | ICD-10-CM | POA: Diagnosis present

## 2014-12-20 DIAGNOSIS — I480 Paroxysmal atrial fibrillation: Secondary | ICD-10-CM | POA: Diagnosis not present

## 2014-12-20 DIAGNOSIS — Z79891 Long term (current) use of opiate analgesic: Secondary | ICD-10-CM

## 2014-12-20 DIAGNOSIS — K8020 Calculus of gallbladder without cholecystitis without obstruction: Secondary | ICD-10-CM | POA: Diagnosis present

## 2014-12-20 DIAGNOSIS — I509 Heart failure, unspecified: Secondary | ICD-10-CM | POA: Diagnosis not present

## 2014-12-20 DIAGNOSIS — K851 Biliary acute pancreatitis without necrosis or infection: Secondary | ICD-10-CM | POA: Diagnosis present

## 2014-12-20 DIAGNOSIS — Z79899 Other long term (current) drug therapy: Secondary | ICD-10-CM

## 2014-12-20 DIAGNOSIS — M21371 Foot drop, right foot: Secondary | ICD-10-CM | POA: Diagnosis not present

## 2014-12-20 DIAGNOSIS — B9620 Unspecified Escherichia coli [E. coli] as the cause of diseases classified elsewhere: Secondary | ICD-10-CM | POA: Diagnosis not present

## 2014-12-20 DIAGNOSIS — I4891 Unspecified atrial fibrillation: Secondary | ICD-10-CM | POA: Diagnosis present

## 2014-12-20 DIAGNOSIS — E440 Moderate protein-calorie malnutrition: Secondary | ICD-10-CM | POA: Insufficient documentation

## 2014-12-20 DIAGNOSIS — D72829 Elevated white blood cell count, unspecified: Secondary | ICD-10-CM | POA: Diagnosis not present

## 2014-12-20 DIAGNOSIS — R7989 Other specified abnormal findings of blood chemistry: Secondary | ICD-10-CM | POA: Diagnosis not present

## 2014-12-20 DIAGNOSIS — F4310 Post-traumatic stress disorder, unspecified: Secondary | ICD-10-CM | POA: Diagnosis not present

## 2014-12-20 DIAGNOSIS — R109 Unspecified abdominal pain: Secondary | ICD-10-CM | POA: Diagnosis present

## 2014-12-20 DIAGNOSIS — Z9884 Bariatric surgery status: Secondary | ICD-10-CM | POA: Diagnosis not present

## 2014-12-20 DIAGNOSIS — J449 Chronic obstructive pulmonary disease, unspecified: Secondary | ICD-10-CM | POA: Diagnosis present

## 2014-12-20 DIAGNOSIS — I4892 Unspecified atrial flutter: Secondary | ICD-10-CM

## 2014-12-20 DIAGNOSIS — J4520 Mild intermittent asthma, uncomplicated: Secondary | ICD-10-CM | POA: Diagnosis not present

## 2014-12-20 DIAGNOSIS — Z6841 Body Mass Index (BMI) 40.0 and over, adult: Secondary | ICD-10-CM

## 2014-12-20 DIAGNOSIS — J439 Emphysema, unspecified: Secondary | ICD-10-CM | POA: Diagnosis present

## 2014-12-20 DIAGNOSIS — R100 Acute abdomen: Secondary | ICD-10-CM | POA: Diagnosis not present

## 2014-12-20 DIAGNOSIS — I483 Typical atrial flutter: Secondary | ICD-10-CM | POA: Diagnosis not present

## 2014-12-20 DIAGNOSIS — J45901 Unspecified asthma with (acute) exacerbation: Secondary | ICD-10-CM | POA: Diagnosis present

## 2014-12-20 HISTORY — DX: Essential (primary) hypertension: I10

## 2014-12-20 HISTORY — DX: Chronic obstructive pulmonary disease, unspecified: J44.9

## 2014-12-20 HISTORY — DX: Heart failure, unspecified: I50.9

## 2014-12-20 LAB — PHOSPHORUS: Phosphorus: 4.8 mg/dL — ABNORMAL HIGH (ref 2.5–4.6)

## 2014-12-20 LAB — COMPREHENSIVE METABOLIC PANEL
ALT: 97 U/L — ABNORMAL HIGH (ref 17–63)
ANION GAP: 14 (ref 5–15)
AST: 137 U/L — AB (ref 15–41)
Albumin: 2.7 g/dL — ABNORMAL LOW (ref 3.5–5.0)
Alkaline Phosphatase: 589 U/L — ABNORMAL HIGH (ref 38–126)
BUN: 17 mg/dL (ref 6–20)
CO2: 19 mmol/L — ABNORMAL LOW (ref 22–32)
Calcium: 8.7 mg/dL — ABNORMAL LOW (ref 8.9–10.3)
Chloride: 102 mmol/L (ref 101–111)
Creatinine, Ser: 1 mg/dL (ref 0.61–1.24)
Glucose, Bld: 123 mg/dL — ABNORMAL HIGH (ref 65–99)
Potassium: 4 mmol/L (ref 3.5–5.1)
Sodium: 135 mmol/L (ref 135–145)
Total Bilirubin: 9 mg/dL — ABNORMAL HIGH (ref 0.3–1.2)
Total Protein: 7 g/dL (ref 6.5–8.1)

## 2014-12-20 LAB — CBC WITH DIFFERENTIAL/PLATELET
BASOS PCT: 0 % (ref 0–1)
Basophils Absolute: 0 10*3/uL (ref 0.0–0.1)
Eosinophils Absolute: 0 10*3/uL (ref 0.0–0.7)
Eosinophils Relative: 0 % (ref 0–5)
HCT: 40.6 % (ref 39.0–52.0)
Hemoglobin: 13.9 g/dL (ref 13.0–17.0)
LYMPHS PCT: 2 % — AB (ref 12–46)
Lymphs Abs: 1 10*3/uL (ref 0.7–4.0)
MCH: 30.1 pg (ref 26.0–34.0)
MCHC: 34.2 g/dL (ref 30.0–36.0)
MCV: 87.9 fL (ref 78.0–100.0)
MONOS PCT: 3 % (ref 3–12)
Monocytes Absolute: 1.5 10*3/uL — ABNORMAL HIGH (ref 0.1–1.0)
Neutro Abs: 46.8 10*3/uL — ABNORMAL HIGH (ref 1.7–7.7)
Neutrophils Relative %: 95 % — ABNORMAL HIGH (ref 43–77)
Platelets: 239 10*3/uL (ref 150–400)
RBC: 4.62 MIL/uL (ref 4.22–5.81)
RDW: 16.2 % — AB (ref 11.5–15.5)
WBC: 49.3 10*3/uL — ABNORMAL HIGH (ref 4.0–10.5)

## 2014-12-20 LAB — LIPASE, BLOOD: Lipase: 696 U/L — ABNORMAL HIGH (ref 22–51)

## 2014-12-20 LAB — MRSA PCR SCREENING: MRSA by PCR: NEGATIVE

## 2014-12-20 LAB — MAGNESIUM: Magnesium: 1.7 mg/dL (ref 1.7–2.4)

## 2014-12-20 MED ORDER — SODIUM CHLORIDE 0.9 % IV SOLN: 1.0000 g | Freq: Two times a day (BID) | INTRAVENOUS | Status: DC

## 2014-12-20 MED ORDER — DOXAZOSIN MESYLATE 2 MG PO TABS
2.0000 mg | ORAL_TABLET | Freq: Every day | ORAL | Status: DC
  Administered 2014-12-21 – 2014-12-29 (×9): 2 mg via ORAL
  Filled 2014-12-20 (×10): qty 1

## 2014-12-20 MED ORDER — BUDESONIDE-FORMOTEROL FUMARATE 160-4.5 MCG/ACT IN AERO: 2.0000 | INHALATION_SPRAY | Freq: Two times a day (BID) | RESPIRATORY_TRACT | Status: DC | PRN

## 2014-12-20 MED ORDER — HEPARIN SODIUM (PORCINE) 5000 UNIT/ML IJ SOLN
5000.0000 [IU] | Freq: Three times a day (TID) | INTRAMUSCULAR | Status: DC
  Administered 2014-12-20 – 2014-12-22 (×5): 5000 [IU] via SUBCUTANEOUS
  Filled 2014-12-20 (×8): qty 1

## 2014-12-20 MED ORDER — SODIUM CHLORIDE 0.9 % IV SOLN
INTRAVENOUS | Status: DC
  Administered 2014-12-20 – 2014-12-23 (×7): via INTRAVENOUS

## 2014-12-20 MED ORDER — LISINOPRIL 40 MG PO TABS
40.0000 mg | ORAL_TABLET | Freq: Every day | ORAL | Status: DC
  Administered 2014-12-20 – 2014-12-29 (×10): 40 mg via ORAL
  Filled 2014-12-20 (×10): qty 1

## 2014-12-20 MED ORDER — POTASSIUM CHLORIDE CRYS ER 20 MEQ PO TBCR
20.0000 meq | EXTENDED_RELEASE_TABLET | Freq: Two times a day (BID) | ORAL | Status: DC
  Administered 2014-12-21 – 2014-12-29 (×15): 20 meq via ORAL
  Filled 2014-12-20 (×18): qty 1

## 2014-12-20 MED ORDER — ONDANSETRON HCL 4 MG/2ML IJ SOLN: 4.0000 mg | Freq: Four times a day (QID) | INTRAMUSCULAR | Status: DC | PRN

## 2014-12-20 MED ORDER — ENSURE ENLIVE PO LIQD
237.0000 mL | Freq: Two times a day (BID) | ORAL | Status: DC
  Administered 2014-12-23 – 2014-12-28 (×9): 237 mL via ORAL

## 2014-12-20 MED ORDER — SODIUM CHLORIDE 0.9 % IV SOLN
1.0000 g | Freq: Three times a day (TID) | INTRAVENOUS | Status: DC
  Administered 2014-12-20 – 2014-12-26 (×16): 1 g via INTRAVENOUS
  Filled 2014-12-20 (×19): qty 1

## 2014-12-20 MED ORDER — ONDANSETRON HCL 4 MG/2ML IJ SOLN: 4.0000 mg | Freq: Four times a day (QID) | INTRAMUSCULAR | Status: DC

## 2014-12-20 MED ORDER — MORPHINE SULFATE 4 MG/ML IJ SOLN
4.0000 mg | INTRAMUSCULAR | Status: DC | PRN
  Administered 2014-12-20 – 2014-12-24 (×17): 4 mg via INTRAVENOUS
  Filled 2014-12-20 (×18): qty 1

## 2014-12-20 MED ORDER — CIPROFLOXACIN IN D5W 400 MG/200ML IV SOLN
400.0000 mg | Freq: Two times a day (BID) | INTRAVENOUS | Status: DC
  Administered 2014-12-20 – 2014-12-22 (×4): 400 mg via INTRAVENOUS
  Filled 2014-12-20 (×5): qty 200

## 2014-12-20 NOTE — Progress Notes (Signed)
Pt arrived via carelink to 3s16. Oriented to unit and room. Call bell within reach. VSS. Paged MD to let them know the patient has arrived. Waiting for orders. Will continue to monitor patient.

## 2014-12-20 NOTE — Consult Note (Signed)
Reason for Consult:abdominal pain Referring Physician: [name] is an 64 y.o. male.  HPI:  The patient is a 64 year old white male who was apparently transferred here from [facility] several weeks ago with gallstone pancreatitis. He has a history of laparoscopic Roux-en-Y gastric bypass in 2008 as well as a history of congestive heart failure. He was seen at [facility] and was apparently discharged with follow-up. He never followed up and then recently had to go back to [facility] with abdominal pain, nausea, and vomiting. From [facility] apparently his lipase was 23,000 and his white count was 45,000. His total bili is elevated at 7.6. His imaging study from [facility] suggests that he has dilated intrahepatic ducts. He was then transferred back to [facility] for more definitive management. He has no imaging or lab work in this hospital system.  Past Medical History  Diagnosis Date  . History of Roux-en-Y gastric bypass 2012  . Paroxysmal a-fib   . Depression   . Asthma   . Right foot drop   . BPH (benign prostatic hyperplasia)   . CHF (congestive heart failure)   . Hypertension   . COPD (chronic obstructive pulmonary disease)     Past Surgical History  Procedure Laterality Date  . Ent surgery      for sleep apnea  . 2 lumbar spine sugeries    . Roux-en-y gastric bypass      Family History  Problem Relation Age of Onset  . Throat cancer Mother     Social History:  reports that he has never smoked. He does not have any smokeless tobacco history on file. He reports that he does not drink alcohol or use illicit drugs.  Allergies: No Known Allergies  Medications: I have reviewed the patient's current medications.  No results found for this or any previous visit (from the past 48 hour(s)).  No results found.  Review of Systems  Constitutional: Positive for weight loss.  HENT: Negative.   Eyes: Negative.   Respiratory:  Negative.   Cardiovascular: Negative.   Gastrointestinal: Positive for nausea, vomiting and abdominal pain.  Genitourinary: Negative.   Musculoskeletal: Negative.   Skin: Negative.   Neurological: Negative.   Endo/Heme/Allergies: Negative.   Psychiatric/Behavioral: Positive for depression.   Blood pressure 171/81, pulse 99, temperature 98.7 F (37.1 C), temperature source Oral, resp. rate 16, height  (1.753 m), weight 134 kg (295 lb 6.7 oz), SpO2 95 %. Physical Exam  Constitutional: He is oriented to person, place, and time.  Obese wm in nad  HENT:  Head: Normocephalic and atraumatic.  Poor dentition  Eyes: Conjunctivae and EOM are normal. Pupils are equal, round, and reactive to light.  Neck: Normal range of motion. Neck supple.  Cardiovascular: Normal rate, regular rhythm and normal heart sounds.   Respiratory: Effort normal and breath sounds normal.  GI: Soft.  There is mild central and upper abdominal tenderness but no guarding or peritonitis. No palpable mass  Musculoskeletal: Normal range of motion.  Neurological: He is alert and oriented to person, place, and time.  Skin: Skin is warm and dry.  Psychiatric: He has a normal mood and affect. His behavior is normal.    Assessment/Plan:  The patient appears to have gallstone pancreatitis. Unfortunately because of his previous gastric bypass he will probably not be able to have an ERCP study. At this point I would recommend reimaging him in this hospital and if he does have dilated intrahepatic  ducts then he may be a candidate for a transhepatic catheter to drain his biliary system. If this catheter can stretch across his common duct into his duodenum then that catheter may also help to break up any common duct stones that he may have. He will need strict bowel rest and IV hydration until his pancreatitis resolves. We will continue to follow him closely with you.  TOTH III,Brett Cline S 12/20/2014, 8:46 PM

## 2014-12-21 ENCOUNTER — Inpatient Hospital Stay (HOSPITAL_COMMUNITY): Payer: Medicare HMO

## 2014-12-21 ENCOUNTER — Encounter (HOSPITAL_COMMUNITY): Payer: Self-pay | Admitting: *Deleted

## 2014-12-21 DIAGNOSIS — J4520 Mild intermittent asthma, uncomplicated: Secondary | ICD-10-CM

## 2014-12-21 DIAGNOSIS — K8050 Calculus of bile duct without cholangitis or cholecystitis without obstruction: Secondary | ICD-10-CM

## 2014-12-21 DIAGNOSIS — J438 Other emphysema: Secondary | ICD-10-CM

## 2014-12-21 DIAGNOSIS — R109 Unspecified abdominal pain: Secondary | ICD-10-CM | POA: Diagnosis present

## 2014-12-21 DIAGNOSIS — K8020 Calculus of gallbladder without cholecystitis without obstruction: Secondary | ICD-10-CM | POA: Diagnosis present

## 2014-12-21 DIAGNOSIS — F329 Major depressive disorder, single episode, unspecified: Secondary | ICD-10-CM

## 2014-12-21 LAB — RAPID URINE DRUG SCREEN, HOSP PERFORMED
AMPHETAMINES: NOT DETECTED
BENZODIAZEPINES: NOT DETECTED
Barbiturates: NOT DETECTED
COCAINE: NOT DETECTED
OPIATES: POSITIVE — AB
Tetrahydrocannabinol: NOT DETECTED

## 2014-12-21 LAB — PROTIME-INR
INR: 1.29 (ref 0.00–1.49)
Prothrombin Time: 16.3 seconds — ABNORMAL HIGH (ref 11.6–15.2)

## 2014-12-21 LAB — BRAIN NATRIURETIC PEPTIDE: B Natriuretic Peptide: 308.8 pg/mL — ABNORMAL HIGH (ref 0.0–100.0)

## 2014-12-21 LAB — TROPONIN I
TROPONIN I: 0.23 ng/mL — AB (ref <0.031)
TROPONIN I: 0.1 ng/mL — AB (ref <0.031)

## 2014-12-21 LAB — LACTIC ACID, PLASMA: LACTIC ACID, VENOUS: 2 mmol/L (ref 0.5–2.0)

## 2014-12-21 MED ORDER — HYDRALAZINE HCL 20 MG/ML IJ SOLN
5.0000 mg | INTRAMUSCULAR | Status: DC | PRN
  Administered 2014-12-22 – 2014-12-23 (×4): 5 mg via INTRAVENOUS
  Filled 2014-12-21 (×5): qty 1

## 2014-12-21 MED ORDER — CETYLPYRIDINIUM CHLORIDE 0.05 % MT LIQD
7.0000 mL | Freq: Two times a day (BID) | OROMUCOSAL | Status: DC
  Administered 2014-12-21 – 2014-12-28 (×14): 7 mL via OROMUCOSAL

## 2014-12-21 MED ORDER — IOHEXOL 300 MG/ML  SOLN
25.0000 mL | INTRAMUSCULAR | Status: AC
  Administered 2014-12-21 (×2): 25 mL via ORAL

## 2014-12-21 MED ORDER — LEVALBUTEROL HCL 1.25 MG/0.5ML IN NEBU
1.2500 mg | INHALATION_SOLUTION | Freq: Four times a day (QID) | RESPIRATORY_TRACT | Status: DC | PRN
  Filled 2014-12-21: qty 0.5

## 2014-12-21 MED ORDER — PNEUMOCOCCAL VAC POLYVALENT 25 MCG/0.5ML IJ INJ
0.5000 mL | INJECTION | INTRAMUSCULAR | Status: AC
  Administered 2014-12-24: 0.5 mL via INTRAMUSCULAR
  Filled 2014-12-21 (×2): qty 0.5

## 2014-12-21 MED ORDER — METHOCARBAMOL 1000 MG/10ML IJ SOLN
500.0000 mg | Freq: Three times a day (TID) | INTRAVENOUS | Status: DC | PRN
  Administered 2014-12-21 – 2014-12-23 (×5): 500 mg via INTRAVENOUS
  Filled 2014-12-21 (×11): qty 5

## 2014-12-21 MED ORDER — CHLORHEXIDINE GLUCONATE 0.12 % MT SOLN
15.0000 mL | Freq: Two times a day (BID) | OROMUCOSAL | Status: DC
  Administered 2014-12-21 – 2014-12-29 (×17): 15 mL via OROMUCOSAL
  Filled 2014-12-21 (×17): qty 15

## 2014-12-21 MED ORDER — LEVALBUTEROL HCL 1.25 MG/0.5ML IN NEBU
1.2500 mg | INHALATION_SOLUTION | Freq: Four times a day (QID) | RESPIRATORY_TRACT | Status: DC
  Administered 2014-12-21 (×2): 1.25 mg via RESPIRATORY_TRACT
  Filled 2014-12-21 (×6): qty 0.5

## 2014-12-21 MED ORDER — PANTOPRAZOLE SODIUM 40 MG IV SOLR
40.0000 mg | Freq: Every day | INTRAVENOUS | Status: DC
  Administered 2014-12-21 – 2014-12-27 (×8): 40 mg via INTRAVENOUS
  Filled 2014-12-21 (×9): qty 40

## 2014-12-21 MED ORDER — METOPROLOL TARTRATE 1 MG/ML IV SOLN
5.0000 mg | Freq: Four times a day (QID) | INTRAVENOUS | Status: DC
Start: 2014-12-21 — End: 2014-12-22
  Administered 2014-12-21 – 2014-12-22 (×4): 5 mg via INTRAVENOUS
  Filled 2014-12-21 (×8): qty 5

## 2014-12-21 NOTE — Progress Notes (Signed)
Initial Nutrition Assessment  DOCUMENTATION CODES:   Non-severe (moderate) malnutrition in context of acute illness/injury, Morbid obesity  INTERVENTION:    Diet advancement as able per MD after procedure, if PO intake is poor, add PO supplements  NUTRITION DIAGNOSIS:   Malnutrition related to altered GI function as evidenced by energy intake < 75% for > 7 days, percent weight loss (5% weight loss within one month).  GOAL:   Patient will meet greater than or equal to 90% of their needs  MONITOR:   Diet advancement, PO intake, Labs, Weight trends  REASON FOR ASSESSMENT:   Malnutrition Screening Tool    ASSESSMENT:   64 y.o. male who was transferred from [facility] due to complications for the second time in the last month due to complicated gallstone pancreatitis.  Patient reports that he has been eating poorly for the past few weeks. He states he was 315 lbs a few weeks ago at [facility], then he was 285 lbs when he was admitted to [facility] recently. He has had at least 5% weight loss over the past month. He has had a lot of abdominal pain related to gallstone pancreatitis. Per discussion with RN, plans for surgery when WBC WNL.  Diet Order:  Diet NPO time specified  Skin:  Reviewed, no issues  Last BM:  7/26  Height:   Ht Readings from Last 1 Encounters:  12/20/14  (1.753 m)    Weight:   Wt Readings from Last 1 Encounters:  12/20/14 295 lb 6.7 oz (134 kg)    Ideal Body Weight:  72.7 kg  BMI:  Body mass index is 43.61 kg/(m^2).  Estimated Nutritional Needs:   Kcal:  2200-2400  Protein:  110-130 gm  Fluid:  2.4 L  EDUCATION NEEDS:   Education needs no appropriate at this time   Joaquin Courts, RD, LDN, CNSC Pager [phone] After Hours Pager [phone]

## 2014-12-21 NOTE — Care Management Note (Signed)
Case Management Note  Patient Details  Name: Brett Cline MRN: [id number] Date of Birth: 07/27/1950  Subjective/Objective:                 Admitted with  Severe gallstone/pancreatis   Action/Plan:  Return to home when medically stable. CM to f/u with d/c needs. Expected Discharge Date:  12/24/14               Expected Discharge Plan:  Home/Self Care  In-House Referral:     Discharge planning Services  CM Consult  Post Acute Care Choice:    Choice offered to:     DME Arranged:    DME Agency:     HH Arranged:    HH Agency:     Status of Service:  In process, will continue to follow  Medicare Important Message Given:    Date Medicare IM Given:    Medicare IM give by:    Date Additional Medicare IM Given:    Additional Medicare Important Message give by:     If discussed at Long Length of Stay Meetings, dates discussed:    Additional Comments: Brett Cline (Daughter)  [phone]  Brett Lesches, RN 12/21/2014, 11:54 AM

## 2014-12-21 NOTE — Progress Notes (Signed)
[facility] TEAM 1 - Stepdown/ICU TEAM Progress Note  Brett Cline ZOX:[id number] DOB: 03/01/1951 DOA: 12/20/2014 PCP: Noni Saupe., MD  Admit HPI / Brief Narrative: 64 y.o. WM PMHx Depression, PTSD, Chronic Back pain, polysubstance abuse, S/P Roux-en-Y gastric bypass 2012, asthma, COPD, HTN, acute MI 2002, CHF, atrial fibrillation, OSA on CPAP recurrent Pancreatitis secondary to pancreatic gallstones and CBD stone, right foot drop  Transferred from [facility] due to complicated for the second time in the last month due to complicated gallstone pancreatitis. At the end of June, the patient was admitted there, transferred to [facility] then discharged a few days later for follow-up with general surgery to set up cholecystectomy at a different time, but the patient was unable to get the scheduled follow-up with general surgery and did not have further significant symptoms until the past few days.  He states that in the last 5-6 days, he has been having progressively worse right upper quadrant/ epigastric pain, which is radiated to the back, he has also had fever, chills, fatigue, frequent nausea with occasional episodes of emesis. He also had an episode of diarrhea 5 days ago. Today, the symptoms were so severe, he went to [facility] emergency department and was subsequently transferred to this facility for further management.   He has also been having productive cough for about a week, associated with shortness of breath and occasional wheezing. He looks acutely ill, but is in no acute distress at the moment   HPI/Subjective: 7/28 A/O 4, states fusion of L4-L5 performed approximately 1989 secondary to injury while lifting heavy stones at work. 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 States after he is discharged last week of June for abdominal pain he was initially scheduled to see Dr. Daphine Deutscher ([name]), but then was informed he had see Dr. Jeani Hawking [facility]  medical), and then return to see Dr. Daphine Deutscher first week August however pain became so severe could not wait for appointment.. States was diagnosed with CHF approximately 14 years ago (unknown type).   Assessment/Plan: Recurrent pancreatitis/recurrent cholelithiasis/choledocholithiasis/Sepsis  -Continue normal saline 155ml/hr -Continue IV antibiotics IV meropenem and and ciprofloxacin. Consider consulting infectious diseases if no improvement.  CHF unknown type -EKG 7/27 shows foot T waves in lead I/AVL, LAD, incomplete RBBB; cannot rule out anterior MI/ischemia -Cycle troponin  -Obtain BNP  Essential Hypertension -Continue lisinopril 40 mg daily -metoprolol IV 5 mg QID -Hydralazine IV 5 mg PRN SBP> 150 or DBP> 100  A- fibrillation -Currently in NSR -See a central hypertension -Not on anticoagulation on admission, would not start any at this point secondary to patient being in NSR, rate controlled, and requiring surgery.  Asthma/COPD  -Continue Symbicort 160-4.5 BID -Xopenex QID PRN wheezing/SOB -CXR pending -Titrate O2 to maintain SPO2 89-93%  Chronic back pain/acute abdominal pain -Patient seen by pain specialist in town for chronic back pain and on PO morphine (unknown dose per patient) -Robaxin 500 mg TID PRN -Continue morphine 4 mg q3hr PRN     Code Status: FULL Family Communication: no family present at time of exam Disposition Plan: Per surgery    Consultants: Dr.James Wyatt ([name])  Procedure/Significant Events: 7/27 PCXR at [facility]; stable cardiomegaly    Culture 7/28 blood pending 7/28 urine pending  Antibiotics: Ciprofloxacin 7/27>> Meropenem 7/27>>  DVT prophylaxis: SCD   Devices  LINES / TUBES:      Continuous Infusions: . sodium chloride 125 mL/hr at 12/21/14 0314    Objective: VITAL SIGNS:  Temp: 98.4 F (36.9 C) (07/28 0512) Temp Source: Oral (07/28 0512) BP: 165/86 mmHg (07/28 0800) Pulse Rate: 103 (07/28  0800) SPO2; FIO2:   Intake/Output Summary (Last 24 hours) at 12/21/14 0857 Last data filed at 12/21/14 0800  Gross per 24 hour  Intake   2210 ml  Output    625 ml  Net   1585 ml     Exam: General: A/O 4, positive discomfort secondary to chronic back pain and acute abdominal pain, No acute respiratory distress Eyes: Negative headache, eye pain, double vision, negative scleral hemorrhage ENT: Negative Runny nose, negative ear pain, negative tinnitus, negative gingival bleeding Neck:  Negative scars, masses, torticollis, lymphadenopathy, JVD Lungs: Clear to auscultation bilaterally without wheezes or crackles Cardiovascular: Regular rate and rhythm without murmur gallop or rub normal S1 and S2 Abdomen: positive abdominal pain greatest in the RUQ, negative dysphagia, distended, soft, bowel sounds positive, no rebound, no ascites, no appreciable mass Extremities: No significant cyanosis, clubbing, positive bilateral 1-2+ pitting edema Psychiatric:  Negative depression, negative anxiety, negative fatigue, negative mania  Neurologic:  Cranial nerves II through XII intact, tongue/uvula midline, all extremities muscle strength 5/5, sensation intact throughout,  negative dysarthria, negative expressive aphasia, negative receptive aphasia.    Data Reviewed: Basic Metabolic Panel:  Recent Labs Lab 12/20/14 2032  NA 135  K 4.0  CL 102  CO2 19*  GLUCOSE 123*  BUN 17  CREATININE 1.00  CALCIUM 8.7*  MG 1.7  PHOS 4.8*   Liver Function Tests:  Recent Labs Lab 12/20/14 2032  AST 137*  ALT 97*  ALKPHOS 589*  BILITOT 9.0*  PROT 7.0  ALBUMIN 2.7*    Recent Labs Lab 12/20/14 2032  LIPASE 696*   No results for input(s): AMMONIA in the last 168 hours. CBC:  Recent Labs Lab 12/20/14 2032  WBC 49.3*  NEUTROABS 46.8*  HGB 13.9  HCT 40.6  MCV 87.9  PLT 239   Cardiac Enzymes: No results for input(s): CKTOTAL, CKMB, CKMBINDEX, TROPONINI in the last 168 hours. BNP (last  3 results) No results for input(s): BNP in the last 8760 hours.  ProBNP (last 3 results) No results for input(s): PROBNP in the last 8760 hours.  CBG: No results for input(s): GLUCAP in the last 168 hours.  Recent Results (from the past 240 hour(s))  MRSA PCR Screening     Status: None   Collection Time: 12/20/14  5:10 PM  Result Value Ref Range Status   MRSA by PCR NEGATIVE NEGATIVE Final    Comment:        The GeneXpert MRSA Assay (FDA approved for NASAL specimens only), is one component of a comprehensive MRSA colonization surveillance program. It is not intended to diagnose MRSA infection nor to guide or monitor treatment for MRSA infections.      Studies:  Recent x-ray studies have been reviewed in detail by the Attending Physician  Scheduled Meds:  Scheduled Meds: . ciprofloxacin  400 mg Intravenous Q12H  . doxazosin  2 mg Oral Q breakfast  . feeding supplement (ENSURE ENLIVE)  237 mL Oral BID BM  . heparin  5,000 Units Subcutaneous 3 times per day  . levalbuterol  1.25 mg Nebulization Q6H  . lisinopril  40 mg Oral Daily  . meropenem (MERREM) IV  1 g Intravenous 3 times per day  . pantoprazole (PROTONIX) IV  40 mg Intravenous QHS  . potassium chloride SA  20 mEq Oral BID WC    Time spent on care  of this patient: 40 mins   Ailani Governale, Roselind Messier , MD  [facility] Office  [phone] Pager [phone]  On-Call/Text Page:      [email or web address]      password [email or web address]  If 7PM-7AM, please contact night-coverage [email or web address] Password [facility] 12/21/2014, 8:57 AM   LOS: 1 day   Care during the described time interval was provided by me .  I have reviewed this patient's available data, including medical history, events of note, physical examination, and all test results as part of my evaluation. I have personally reviewed and interpreted all radiology studies.   Carolyne Littles, MD [phone] Pager

## 2014-12-21 NOTE — Progress Notes (Signed)
[name]/Aneshia Jacquet Progress Note    Subjective: Patient told me about how he ended up getting readmitted.  Basically he was to have the surgery done through our office with the assistance of the GI doctor, but the patient redeveloped symptoms before he could get all this arranged.  Objective: Vital signs in last 24 hours: Temp:  [97.6 F (36.4 C)-99.4 F (37.4 C)] 98.4 F (36.9 C) (07/28 0512) Pulse Rate:  [87-106] 89 (07/28 0649) Resp:  [15-19] 19 (07/28 0649) BP: (156-179)/(79-85) 179/81 mmHg (07/28 0649) SpO2:  [92 %-96 %] 92 % (07/28 0649) Weight:  [134 kg (295 lb 6.7 oz)] 134 kg (295 lb 6.7 oz) (07/27 1710) Last BM Date: 12/19/14  Intake/Output from previous day: 07/27 0701 - 07/28 0700 In: 1600 [I.V.:1200; IV Piggyback:400] Out: 625 [Urine:625] Intake/Output this shift:    General: No acute distress  Lungs: Clear  Abd: Tender, but no peritonitis.  Want to eat, but not yet  Extremities: No changes  Neuro: Intact  Lab Results:  (wbc:2,hgb:2,hct:2,plt:2) BMET  Recent Labs  12/20/14 2032  NA 135  K 4.0  CL 102  CO2 19*  GLUCOSE 123*  BUN 17  CREATININE 1.00  CALCIUM 8.7*   PT/INR No results for input(s): LABPROT, INR in the last 72 hours. ABG No results for input(s): PHART, HCO3 in the last 72 hours.  Invalid input(s): PCO2, PO2  Studies/Results: No results found.  Anti-infectives: Anti-infectives    Start     Dose/Rate Route Frequency Ordered Stop   12/20/14 2200  meropenem (MERREM) 1 g in sodium chloride 0.9 % 100 mL IVPB  Status:  Discontinued     1 g 200 mL/hr over 30 Minutes Intravenous Every 12 hours 12/20/14 2122 12/20/14 2155   12/20/14 2200  ciprofloxacin (CIPRO) IVPB 400 mg     400 mg 200 mL/hr over 60 Minutes Intravenous Every 12 hours 12/20/14 2122     12/20/14 2200  meropenem (MERREM) 1 g in sodium chloride 0.9 % 100 mL IVPB     1 g 200 mL/hr over 30 Minutes Intravenous 3 times per day 12/20/14 2155         Assessment/Plan: s/p  I spoke with Dr. Wenda Low who is very familiar with this patient and he will attempt to contact the GI doc and arrange for surgery after he has stabilized.  LOS: 1 day   Marta Lamas. Gae Bon, MD, FACS [phone] [facility] 12/21/2014

## 2014-12-21 NOTE — H&P (Addendum)
[facility] History and Physical  Brett Cline:[id number] DOB: [birth year] DOA: 12/20/2014  Referring physician: [facility]. PCP: Noni Saupe., MD   Chief Complaint: Abdominal pain.  HPI: Brett Cline is a 64 y.o. male past medical history as below who is transferred from [facility] due to complicated for the second time in the last month due to complicated gallstone pancreatitis. At the end of June, the patient was admitted there, transferred to [facility] then discharged a few days later for follow-up with general surgery to set up cholecystectomy at a different time, but the patient was unable to get the scheduled follow-up with general surgery and did not have further significant symptoms until the past few days.  He states that in the last 5-6 days, he has been having progressively worse right upper quadrant/ epigastric pain, which is radiated to the back, he has also had fever, chills, fatigue, frequent nausea with occasional episodes of emesis. He also had an episode of diarrhea 5 days ago. Today, the symptoms were so severe, he went to [facility] emergency department and was subsequently transferred to this facility for further management.   He has also been having productive cough for about a week, associated with shortness of breath and occasional wheezing. He looks acutely ill, but is in no acute distress at the moment  Review of Systems:  Constitutional:  Positive weight loss, night sweats, Fevers, chills, fatigue.  HEENT:  Mild headaches,  Negative Difficulty swallowing,Tooth/dental problems,Sore throat,  No sneezing, itching, ear ache, nasal congestion, post nasal drip,  Cardio-vascular:  No chest pain, Orthopnea, PND, swelling in lower extremities, anasarca, dizziness, palpitations  GI:  Positive heartburn, indigestion, abdominal pain, nausea, vomiting, (diarrhea 5 days ago), change in bowel habits, loss of appetite    Resp:  Positive shortness of breath with exertion or at rest. Positive productive cough,  No coughing up of blood.No change in color of mucus.No wheezing.No chest wall deformity  Skin:  no rash or lesions.  GU:  no dysuria, change in color of urine, no urgency or frequency. No flank pain.  Musculoskeletal:  No joint pain or swelling. No decreased range of motion. No back pain.  Psych:  No change in mood or affect. No depression or anxiety. No memory loss.   Past Medical History  Diagnosis Date  . History of Roux-en-Y gastric bypass 2012  . Paroxysmal a-fib   . Depression   . Asthma   . Right foot drop   . BPH (benign prostatic hyperplasia)   . CHF (congestive heart failure)   . Hypertension   . COPD (chronic obstructive pulmonary disease)    Past Surgical History  Procedure Laterality Date  . Ent surgery      for sleep apnea  . 2 lumbar spine sugeries    . Roux-en-y gastric bypass     Social History:  reports that he has never smoked. He does not have any smokeless tobacco history on file. He reports that he does not drink alcohol or use illicit drugs.  No Known Allergies  Family History  Problem Relation Age of Onset  . Throat cancer Mother     Prior to Admission medications   Medication Sig Start Date End Date Taking? Authorizing Provider  amitriptyline (ELAVIL) 25 MG tablet Take 25 mg by mouth 3 (three) times daily.  10/25/14  Yes Historical Provider, MD  aspirin-acetaminophen-caffeine (EXCEDRIN MIGRAINE) [phone] MG per tablet Take 2 tablets by mouth every 6 (six)  hours as needed for migraine.   Yes Historical Provider, MD  azelastine (ASTELIN) 0.1 % nasal spray Place 1 spray into both nostrils daily.    Yes Historical Provider, MD  buPROPion (WELLBUTRIN XL) 150 MG 24 hr tablet Take 150 mg by mouth daily.  09/22/14  Yes Historical Provider, MD  buPROPion (WELLBUTRIN XL) 300 MG 24 hr tablet Take 300 mg by mouth daily.  09/22/14  Yes Historical Provider, MD   dicyclomine (BENTYL) 20 MG tablet Take 20 mg by mouth 4 (four) times daily -  before meals and at bedtime.  09/22/14  Yes Historical Provider, MD  doxazosin (CARDURA) 2 MG tablet Take 2 mg by mouth daily with breakfast.  09/22/14  Yes Historical Provider, MD  escitalopram (LEXAPRO) 10 MG tablet Take 10 mg by mouth 3 (three) times daily.  10/25/14  Yes Historical Provider, MD  felodipine (PLENDIL) 5 MG 24 hr tablet Take 10 mg by mouth daily.   Yes Historical Provider, MD  fluticasone (FLONASE) 50 MCG/ACT nasal spray Place 1 spray into both nostrils daily as needed for allergies.  09/22/14  Yes Historical Provider, MD  furosemide (LASIX) 40 MG tablet Take 40 mg by mouth daily as needed for fluid.  11/25/14  Yes Historical Provider, MD  HYDROcodone-acetaminophen (NORCO) 10-325 MG per tablet Take 0.5-2 tablets by mouth every 6 (six) hours as needed (pain).  09/26/14  Yes Historical Provider, MD  lisinopril (PRINIVIL,ZESTRIL) 40 MG tablet Take 40 mg by mouth daily.  09/22/14  Yes Historical Provider, MD  polyvinyl alcohol (LIQUIFILM TEARS) 1.4 % ophthalmic solution Place 1 drop into both eyes every 6 (six) hours as needed for dry eyes.   Yes Historical Provider, MD  potassium chloride SA (K-DUR,KLOR-CON) 20 MEQ tablet Take 20 mEq by mouth 2 (two) times daily with a meal.  10/25/14  Yes Historical Provider, MD  SYMBICORT 160-4.5 MCG/ACT inhaler Inhale 2 puffs into the lungs 2 (two) times daily as needed (wheezing).  10/25/14  Yes Historical Provider, MD  tiZANidine (ZANAFLEX) 4 MG tablet Take 4-8 mg by mouth every 6 (six) hours as needed for muscle spasms.  11/21/14  Yes Historical Provider, MD   Physical Exam: Filed Vitals:   12/20/14 1710 12/20/14 1850 12/20/14 1926 12/20/14 2248  BP: 160/79 173/81 171/81 156/85  Pulse:  103 99 106  Temp: 99.4 F (37.4 C)  98.7 F (37.1 C) 97.6 F (36.4 C)  TempSrc: Oral  Oral Oral  Resp:  Height:  (1.753 m)     Weight: 134 kg (295 lb 6.7 oz)     SpO2:   95%  92%    Wt Readings from Last 3 Encounters:  12/20/14 134 kg (295 lb 6.7 oz)  11/25/14 138.4 kg (305 lb 1.9 oz)    General:  Appears calm and comfortable Eyes: PERRL, normal lids, irises & conjunctiva ENT: grossly normal hearing, lips & tongue mildly dry. Neck: no LAD, masses or thyromegaly Cardiovascular: RRR, no m/r/g. No LE edema. Telemetry: SR, mildly tachycardic in the low 100s  Respiratory: Bilateral rhonchi. Abdomen: soft, positive epigastric and right upper quadrant pain on palpation without guarding or rebound tenderness. Skin: no rash or induration seen on limited exam Musculoskeletal: grossly normal tone BUE/BLE Psychiatric: grossly normal mood and affect, speech fluent and appropriate Neurologic: grossly non-focal.          Labs on Admission:  Basic Metabolic Panel:  Recent Labs Lab 12/20/14 2032  NA 135  K 4.0  CL 102  CO2 19  GLUCOSE 123  BUN 17  CREATININE 1.00  CALCIUM 8.7  MG 1.7  PHOS 4.8   Liver Function Tests:  Recent Labs Lab 12/20/14 2032  AST 137  ALT 97  ALKPHOS 589  BILITOT 9.0  PROT 7.0  ALBUMIN 2.7    Recent Labs Lab 12/20/14 2032  LIPASE 696   No results for input(s): AMMONIA in the last 168 hours. CBC:  Recent Labs Lab 12/20/14 2032  WBC 49.3  NEUTROABS 46.8  HGB 13.9  HCT 40.6  MCV 87.9  PLT 239   Cardiac Enzymes: No results for input(s): CKTOTAL, CKMB, CKMBINDEX, TROPONINI in the last 168 hours.  BNP (last 3 results) No results for input(s): BNP in the last 8760 hours.  ProBNP (last 3 results) No results for input(s): PROBNP in the last 8760 hours.  CBG: No results for input(s): GLUCAP in the last 168 hours.  Radiological Exams on Admission: No results found.  EKG: Independently reviewed.  Vent. rate 91 BPM PR interval 180 ms QRS duration 106 ms QT/QTc 338/415 ms P-R-T axes 58 -39 84  Normal sinus rhythm Left axis deviation Incomplete right bundle branch block Nonspecific T wave  abnormality  Assessment/Plan Principal Problem:   Acute gallstone pancreatitis Active Problems:   Leukocytosis   HTN (hypertension), benign   Morbid obesity   Depression   Asthma, chronic   1. IV meropenem and and ciprofloxacin. Consider consulting infectious diseases if no improvement. 2. General. surgery is on the case (Dr. Chevis Pretty). Please see his note. 3. Continue pertinent home meds. 4. Continue Symbicort, but will add Xopenex nebulizer treatments to his regimen since he has a                history of A. fib in the past. Send sputum for culture and sensitivity. 5. GI and DVT prophylaxis. 6. The patient has a history of heart disease, which he couldn't specify, but seems that he has a history of atrial fibrillation, and congestive heart failure in the past. He states he hasn't had workup in some time now, so I will order an echocardiogram. Consider Cardiology to evaluate before any major procedure.    Code Status: Full code. DVT Prophylaxis: Heparin subcutaneously. Family Communication:  Brett Cline, Brett Cline  [phone]  Disposition Plan: Home with outpatient follow-up.  Time spent: over 90 minutes.  Bobette Mo [facility] Pager [phone]

## 2014-12-22 ENCOUNTER — Inpatient Hospital Stay (HOSPITAL_COMMUNITY): Payer: Medicare HMO

## 2014-12-22 ENCOUNTER — Encounter (HOSPITAL_COMMUNITY): Payer: Self-pay | Admitting: Physician Assistant

## 2014-12-22 DIAGNOSIS — I10 Essential (primary) hypertension: Secondary | ICD-10-CM | POA: Diagnosis present

## 2014-12-22 DIAGNOSIS — I4891 Unspecified atrial fibrillation: Secondary | ICD-10-CM | POA: Diagnosis present

## 2014-12-22 DIAGNOSIS — K8020 Calculus of gallbladder without cholecystitis without obstruction: Secondary | ICD-10-CM

## 2014-12-22 DIAGNOSIS — K851 Biliary acute pancreatitis: Secondary | ICD-10-CM

## 2014-12-22 DIAGNOSIS — I509 Heart failure, unspecified: Secondary | ICD-10-CM

## 2014-12-22 DIAGNOSIS — R7989 Other specified abnormal findings of blood chemistry: Secondary | ICD-10-CM

## 2014-12-22 DIAGNOSIS — J439 Emphysema, unspecified: Secondary | ICD-10-CM

## 2014-12-22 DIAGNOSIS — K859 Acute pancreatitis without necrosis or infection, unspecified: Secondary | ICD-10-CM | POA: Diagnosis present

## 2014-12-22 DIAGNOSIS — E440 Moderate protein-calorie malnutrition: Secondary | ICD-10-CM | POA: Insufficient documentation

## 2014-12-22 DIAGNOSIS — J45901 Unspecified asthma with (acute) exacerbation: Secondary | ICD-10-CM

## 2014-12-22 LAB — URINE CULTURE: CULTURE: NO GROWTH

## 2014-12-22 LAB — COMPREHENSIVE METABOLIC PANEL
ALBUMIN: 2.3 g/dL — AB (ref 3.5–5.0)
ALT: 72 U/L — AB (ref 17–63)
ANION GAP: 7 (ref 5–15)
AST: 74 U/L — AB (ref 15–41)
Alkaline Phosphatase: 499 U/L — ABNORMAL HIGH (ref 38–126)
BUN: 13 mg/dL (ref 6–20)
CHLORIDE: 105 mmol/L (ref 101–111)
CO2: 22 mmol/L (ref 22–32)
CREATININE: 0.76 mg/dL (ref 0.61–1.24)
Calcium: 8.5 mg/dL — ABNORMAL LOW (ref 8.9–10.3)
GFR calc Af Amer: >60 mL/min (ref >60)
GFR calc non Af Amer: >60 mL/min (ref >60)
Glucose, Bld: 84 mg/dL (ref 65–99)
Potassium: 3.9 mmol/L (ref 3.5–5.1)
Sodium: 134 mmol/L — ABNORMAL LOW (ref 135–145)
TOTAL PROTEIN: 6.3 g/dL — AB (ref 6.5–8.1)
Total Bilirubin: 3.3 mg/dL — ABNORMAL HIGH (ref 0.3–1.2)

## 2014-12-22 LAB — CBC WITH DIFFERENTIAL/PLATELET
BASOS ABS: 0 10*3/uL (ref 0.0–0.1)
Basophils Relative: 0 % (ref 0–1)
EOS ABS: 0.1 10*3/uL (ref 0.0–0.7)
Eosinophils Relative: 0 % (ref 0–5)
HEMATOCRIT: 34.9 % — AB (ref 39.0–52.0)
Hemoglobin: 11.6 g/dL — ABNORMAL LOW (ref 13.0–17.0)
Lymphocytes Relative: 6 % — ABNORMAL LOW (ref 12–46)
Lymphs Abs: 1 10*3/uL (ref 0.7–4.0)
MCH: 29.9 pg (ref 26.0–34.0)
MCHC: 33.2 g/dL (ref 30.0–36.0)
MCV: 89.9 fL (ref 78.0–100.0)
Monocytes Absolute: 0.9 10*3/uL (ref 0.1–1.0)
Monocytes Relative: 5 % (ref 3–12)
NEUTROS PCT: 89 % — AB (ref 43–77)
Neutro Abs: 14.7 10*3/uL — ABNORMAL HIGH (ref 1.7–7.7)
PLATELETS: 202 10*3/uL (ref 150–400)
RBC: 3.88 MIL/uL — ABNORMAL LOW (ref 4.22–5.81)
RDW: 16.3 % — ABNORMAL HIGH (ref 11.5–15.5)
WBC: 16.7 10*3/uL — ABNORMAL HIGH (ref 4.0–10.5)

## 2014-12-22 LAB — MAGNESIUM: MAGNESIUM: 1.9 mg/dL (ref 1.7–2.4)

## 2014-12-22 LAB — TROPONIN I

## 2014-12-22 LAB — LIPASE, BLOOD: Lipase: 120 U/L — ABNORMAL HIGH (ref 22–51)

## 2014-12-22 MED ORDER — METOPROLOL TARTRATE 25 MG PO TABS
25.0000 mg | ORAL_TABLET | Freq: Two times a day (BID) | ORAL | Status: DC
  Administered 2014-12-22 – 2014-12-23 (×3): 25 mg via ORAL
  Filled 2014-12-22 (×4): qty 1

## 2014-12-22 MED ORDER — HEPARIN SODIUM (PORCINE) 5000 UNIT/ML IJ SOLN
5000.0000 [IU] | Freq: Three times a day (TID) | INTRAMUSCULAR | Status: DC
  Administered 2014-12-22 – 2014-12-23 (×4): 5000 [IU] via SUBCUTANEOUS
  Filled 2014-12-22 (×3): qty 1

## 2014-12-22 MED ORDER — PERFLUTREN LIPID MICROSPHERE
1.0000 mL | INTRAVENOUS | Status: AC | PRN
  Administered 2014-12-22: 2 mL via INTRAVENOUS
  Filled 2014-12-22: qty 10

## 2014-12-22 NOTE — Progress Notes (Signed)
  Echocardiogram 2D Echocardiogram with Definity has been performed.  Tye Savoy 12/22/2014, 10:10 AM

## 2014-12-22 NOTE — Progress Notes (Signed)
Pt blood pressure 176/91 after 5 mg of hydralazine given.  Pt BP has been trending high.  MD aware.  Will continue to monitor closely.

## 2014-12-22 NOTE — Progress Notes (Addendum)
[place] TEAM 1 - Stepdown/ICU TEAM Progress Note  ANICETO KYSER ZOX:[id number] DOB: [birth year] DOA: 12/20/2014 PCP: Noni Saupe., MD  Admit HPI / Brief Narrative: 64 y.o. WM PMHx Depression, PTSD, Chronic Back pain, polysubstance abuse, S/P Roux-en-Y gastric bypass 2012, asthma, COPD, HTN, acute MI 2002, CHF, atrial fibrillation, OSA on CPAP recurrent Pancreatitis secondary to pancreatic gallstones and CBD stone, right foot drop  Transferred from [facility] due to complicated for the second time in the last month due to complicated gallstone pancreatitis. At the end of June, the patient was admitted there, transferred to [facility] then discharged a few days later for follow-up with general surgery to set up cholecystectomy at a different time, but the patient was unable to get the scheduled follow-up with general surgery and did not have further significant symptoms until the past few days.  He states that in the last 5-6 days, he has been having progressively worse right upper quadrant/ epigastric pain, which is radiated to the back, he has also had fever, chills, fatigue, frequent nausea with occasional episodes of emesis. He also had an episode of diarrhea 5 days ago. Today, the symptoms were so severe, he went to [facility] emergency department and was subsequently transferred to this facility for further management.   He has also been having productive cough for about a week, associated with shortness of breath and occasional wheezing. He looks acutely ill, but is in no acute distress at the moment   HPI/Subjective: 7/29 A/O 4, states fusion of L4-L5 performed approximately 1989 secondary to injury while lifting heavy stones at work. States he sees a pain specialist for his back. States after he was discharged last week of June for abdominal pain he was initially scheduled to see Dr. Daphine Deutscher ([name]), but then was informed he had see Dr. Jeani Hawking [facility]  medical), and then return to see Dr. Daphine Deutscher first week August however pain became so severe could not wait for appointment.. States was diagnosed with CHF approximately 14 years ago (unknown type). Negative SOB, negative DOE, negative CP, negative N/V.   Assessment/Plan: Recurrent Acute pancreatitis/recurrent cholelithiasis/choledocholithiasis/Sepsis  -Decrease normal saline 57ml/hr -Continue IV antibiotics meropenem and and ciprofloxacin.  -Sepsis physiology improving.  Bacteremia Escherichia coli -Organism pansensitive continue current antibiotics regimen  CHF unknown type -EKG 7/27 showed foot T waves in lead I/AVL, LAD, incomplete RBBB; cannot rule out anterior MI/ischemia - troponin initially positive but has trended down now WNL   -BNP= 08 -7/29 Echocardiogram completed but not read  Essential Hypertension -Continue lisinopril 40 mg daily -metoprolol 25 mg BID -Hydralazine IV 5 mg PRN SBP> 150 or DBP> 100  A- fibrillation -Currently in NSR -See Essential hypertension -Not on anticoagulation on admission, would not start any at this point secondary to patient being in NSR, rate controlled, and requiring surgery.  Asthma/COPD  -Continue Symbicort 160-4.5 BID -Xopenex QID PRN wheezing/SOB -CXR pending -Titrate O2 to maintain SPO2 89-93%  Chronic back pain/acute abdominal pain -Patient seen by pain specialist in town for chronic back pain and on PO morphine (unknown dose per patient) -Robaxin 500 mg TID PRN -Continue morphine 4 mg q3hr PRN     Code Status: FULL Family Communication: no family present at time of exam Disposition Plan: Transfer to [facility] for surgery    Consultants: Dr.James Wyatt ([name])  Procedure/Significant Events: 7/27 PCXR at [facility]; stable cardiomegaly    Culture 7/27 blood positive Escherichia coli pansensitive 7/28 blood pending 7/28 urine  negative  Antibiotics: Ciprofloxacin 7/27>> Meropenem 7/27>>   DVT  prophylaxis: SCD   Devices  LINES / TUBES:      Continuous Infusions: . sodium chloride 125 mL/hr at 12/22/14 0050    Objective: VITAL SIGNS: Temp: 98.9 F (37.2 C) (07/29 0735) Temp Source: Oral (07/29 0735) BP: 176/93 mmHg (07/29 0900) Pulse Rate: 112 (07/29 0900) SPO2; FIO2:   Intake/Output Summary (Last 24 hours) at 12/22/14 1206 Last data filed at 12/22/14 0900  Gross per 24 hour  Intake 2138.33 ml  Output   2400 ml  Net -261.67 ml     Exam: General: A/O 4, NAD, No acute respiratory distress Eyes: Negative headache, eye pain, double vision, negative scleral hemorrhage ENT: Negative Runny nose, negative ear pain, negative tinnitus, negative gingival bleeding Neck:  Negative scars, masses, torticollis, lymphadenopathy, JVD Lungs: Clear to auscultation bilaterally without wheezes or crackles Cardiovascular: Regular rate and rhythm without murmur gallop or rub normal S1 and S2 Abdomen: Negative Abdominal pain, negative dysphagia, distended, soft, bowel sounds positive, no rebound, no ascites, no appreciable mass Extremities: No significant cyanosis, clubbing, positive bilateral 1-2+ pitting edema Psychiatric:  Negative depression, negative anxiety, negative fatigue, negative mania  Neurologic:  Cranial nerves II through XII intact, tongue/uvula midline, all extremities muscle strength 5/5, sensation intact throughout,  negative dysarthria, negative expressive aphasia, negative receptive aphasia.    Data Reviewed: Basic Metabolic Panel:  Recent Labs Lab 12/20/14 2032 12/22/14 0240  NA 135 134*  K 4.0 3.9  CL 102 105  CO2 19* 22  GLUCOSE 123* 84  BUN 17 13  CREATININE 1.00 0.76  CALCIUM 8.7* 8.5*  MG 1.7 1.9  PHOS 4.8*  --    Liver Function Tests:  Recent Labs Lab 12/20/14 2032 12/22/14 0240  AST 137* 74*  ALT 97* 72*  ALKPHOS 589* 499*  BILITOT 9.0* 3.3*  PROT 7.0 6.3*  ALBUMIN 2.7* 2.3*    Recent Labs Lab 12/20/14 2032  12/22/14 0240  LIPASE 696* 120*   No results for input(s): AMMONIA in the last 168 hours. CBC:  Recent Labs Lab 12/20/14 2032 12/22/14 0240  WBC 49.3* 16.7*  NEUTROABS 46.8* 14.7*  HGB 13.9 11.6*  HCT 40.6 34.9*  MCV 87.9 89.9  PLT 239 202   Cardiac Enzymes:  Recent Labs Lab 12/21/14 1133 12/21/14 1810 12/21/14 2350  TROPONINI 0.23* 0.10* <0.03   BNP (last 3 results)  Recent Labs  12/21/14 1133  BNP 308.8*    ProBNP (last 3 results) No results for input(s): PROBNP in the last 8760 hours.  CBG: No results for input(s): GLUCAP in the last 168 hours.  Recent Results (from the past 240 hour(s))  MRSA PCR Screening     Status: None   Collection Time: 12/20/14  5:10 PM  Result Value Ref Range Status   MRSA by PCR NEGATIVE NEGATIVE Final    Comment:        The GeneXpert MRSA Assay (FDA approved for NASAL specimens only), is one component of a comprehensive MRSA colonization surveillance program. It is not intended to diagnose MRSA infection nor to guide or monitor treatment for MRSA infections.   Culture, Urine     Status: None   Collection Time: 12/21/14 11:52 AM  Result Value Ref Range Status   Specimen Description URINE, CLEAN CATCH  Final   Special Requests NONE  Final   Culture NO GROWTH 1 DAY  Final   Report Status 12/22/2014 FINAL  Final     Studies:  Recent x-ray studies have been reviewed in detail by the Attending Physician  Scheduled Meds:  Scheduled Meds: . antiseptic oral rinse  7 mL Mouth Rinse q12n4p  . chlorhexidine  15 mL Mouth Rinse BID  . ciprofloxacin  400 mg Intravenous Q12H  . doxazosin  2 mg Oral Q breakfast  . feeding supplement (ENSURE ENLIVE)  237 mL Oral BID BM  . heparin  5,000 Units Subcutaneous 3 times per day  . lisinopril  40 mg Oral Daily  . meropenem (MERREM) IV  1 g Intravenous 3 times per day  . metoprolol tartrate  25 mg Oral BID  . pantoprazole (PROTONIX) IV  40 mg Intravenous QHS  . pneumococcal 23  valent vaccine  0.5 mL Intramuscular Tomorrow-1000  . potassium chloride SA  20 mEq Oral BID WC    Time spent on care of this patient: 40 mins   Shae Hinnenkamp, Roselind Messier , MD  [facility] Office  [phone] Pager - [phone]  On-Call/Text Page:      [email or web address]      password [email or web address]  If 7PM-7AM, please contact night-coverage [email or web address] Password [email or web address] 12/22/2014, 12:06 PM   LOS: 2 days   Care during the described time interval was provided by me .  I have reviewed this patient's available data, including medical history, events of note, physical examination, and all test results as part of my evaluation. I have personally reviewed and interpreted all radiology studies.   Carolyne Littles, MD [phone] Pager

## 2014-12-22 NOTE — Progress Notes (Signed)
[facility] here to take pt to [facility]

## 2014-12-22 NOTE — Progress Notes (Signed)
Abx change:  Pt was tx here from [place] and he will likely need lap chole and is being tx to [name] today. [name] faxed [name] the blood cultures and he is positive for e.coli that is pan sens except for amp and unasyn. He is currently on Merrem and cipro. D/w Dr. Joseph Art, given ok to dc cipro. He is going to defer de-escalation of merrem to [name] after transfer.   Plan  Dc cipro Consider de-escalating to rocephin 2g IV q24 for e.coli bactermia  Brett Cline, PharmD Pager: [phone] 12/22/2014 1:32 PM

## 2014-12-22 NOTE — Consult Note (Signed)
Gastroenterology Consult                                                                             covering for Dr. Benson Norway  1:15 PM 12/22/2014  LOS: 2 days    Referring Provider: Dr. Hulen Skains Dr. Hassell Done Primary Care Physician:  Angelina Sheriff., MD in [place] Primary Gastroenterologist:  Dr. Carol Ada    Reason for Consultation:  Need for ERCP.   HPI: Brett Cline is a 64 y.o. male. status post bariatric Roux-en-Y gastric bypass 2012. COPD. CHF. CAD. MI in 2002. Chronic back pain treated with hydrocodone.  Polysubstance abuse. Atrial fibrillation. Obstructive sleep apnea on CPAP though he has had some sort of ENT surgery to address the sleep apnea in the past. Mobility limited due to right foot drop.  Hospital admission 11/22/14 through 11/25/14 with symptomatic choledocholithiasis. Alkaline phosphatase was 501, T bili 4.9, AST 97, ALT 96. Lipase 11.  A 11/22/14 CT performed at [facility] prior to transfer to [facility], showed dilated intra-and extrahepatic biliary ducts with a 7 mm stone in the distal common bile duct. Patient's abdominal pain resolved and his lipase normalized.  Dr. Leighton Ruff of Gen. surgery suspected that patient may have passed a common duct stone.  She suggested follow-up with bariatric surgeon at the office for arrangement of elective cholecystectomy and possible intraoperative ERCP via the gastric remnant if there was any ongoing suspicion for choledocholithiasis. He was seen by general surgeon, I believe this was Dr. Hassell Done, on July 3. An office appointment for August 3 was arranged with Dr. Carol Ada regarding the possible need for ERCP. Dr. Collene Mares called me to see the patient for her today. Dr. Benson Norway is out of town.  Patient returned to [facility] 7/27 with recurrent abdominal  pain, nausea and vomiting. Lipase there was 23,000, t bili was 7.6, alk phos 1043, AST/ALT 188/130, WBC of 49,000.   Blood cultures grew E coli.  U/A at [facility] was + for UTI.  Today the lipase is 696, total bilirubin  9, alkaline phosphatase 589, AST 137, ALT 97.  CT scan shows  slight haziness of the peripancreatic fat suggestive of acute pancreatitis, biliary sludge, gallbladder wall thickened at 6 mm but no strong evidence for acute cholecystitis.   There were some changes in the urinary bladder as well and nonobstructing renal calculi.  Baseline uses 20 mg percocet per day, divided into 4 doses of 5 mg each for back pain.  Currently prn morphine is helpful.  No nausea today.                                   Past Medical History  Diagnosis Date  .  History of Roux-en-Y gastric bypass 2012  . Paroxysmal a-fib   . Depression   . Asthma   . Right foot drop   . BPH (benign prostatic hyperplasia)   . CHF (congestive heart failure)   . Hypertension   . COPD (chronic obstructive pulmonary disease)   . Benign prostate hyperplasia     Past Surgical History  Procedure Laterality Date  . Ent surgery      for sleep apnea  . 2 lumbar spine sugeries    . Roux-en-y gastric bypass      Prior to Admission medications   Medication Sig Start Date End Date Taking? Authorizing Provider  amitriptyline (ELAVIL) 25 MG tablet Take 25 mg by mouth 3 (three) times daily.  10/25/14  Yes Historical Provider, MD  aspirin-acetaminophen-caffeine (EXCEDRIN MIGRAINE) [phone] MG per tablet Take 2 tablets by mouth every 6 (six) hours as needed for migraine.   Yes Historical Provider, MD  azelastine (ASTELIN) 0.1 % nasal spray Place 1 spray into both nostrils daily.    Yes Historical Provider, MD  buPROPion (WELLBUTRIN XL) 150 MG 24 hr tablet Take 150 mg by mouth daily.  09/22/14  Yes Historical Provider, MD  buPROPion (WELLBUTRIN XL) 300 MG 24 hr tablet Take 300 mg by mouth daily.  09/22/14  Yes Historical  Provider, MD  dicyclomine (BENTYL) 20 MG tablet Take 20 mg by mouth 4 (four) times daily -  before meals and at bedtime.  09/22/14  Yes Historical Provider, MD  doxazosin (CARDURA) 2 MG tablet Take 2 mg by mouth daily with breakfast.  09/22/14  Yes Historical Provider, MD  escitalopram (LEXAPRO) 10 MG tablet Take 10 mg by mouth 3 (three) times daily.  10/25/14  Yes Historical Provider, MD  felodipine (PLENDIL) 5 MG 24 hr tablet Take 10 mg by mouth daily.   Yes Historical Provider, MD  fluticasone (FLONASE) 50 MCG/ACT nasal spray Place 1 spray into both nostrils daily as needed for allergies.  09/22/14  Yes Historical Provider, MD  furosemide (LASIX) 40 MG tablet Take 40 mg by mouth daily as needed for fluid.  11/25/14  Yes Historical Provider, MD  HYDROcodone-acetaminophen (NORCO) 10-325 MG per tablet Take 0.5-2 tablets by mouth every 6 (six) hours as needed (pain).  09/26/14  Yes Historical Provider, MD  lisinopril (PRINIVIL,ZESTRIL) 40 MG tablet Take 40 mg by mouth daily.  09/22/14  Yes Historical Provider, MD  polyvinyl alcohol (LIQUIFILM TEARS) 1.4 % ophthalmic solution Place 1 drop into both eyes every 6 (six) hours as needed for dry eyes.   Yes Historical Provider, MD  potassium chloride SA (K-DUR,KLOR-CON) 20 MEQ tablet Take 20 mEq by mouth 2 (two) times daily with a meal.  10/25/14  Yes Historical Provider, MD  SYMBICORT 160-4.5 MCG/ACT inhaler Inhale 2 puffs into the lungs 2 (two) times daily as needed (wheezing).  10/25/14  Yes Historical Provider, MD  tiZANidine (ZANAFLEX) 4 MG tablet Take 4-8 mg by mouth every 6 (six) hours as needed for muscle spasms.  11/21/14  Yes Historical Provider, MD    Scheduled Meds: . antiseptic oral rinse  7 mL Mouth Rinse q12n4p  . chlorhexidine  15 mL Mouth Rinse BID  . ciprofloxacin  400 mg Intravenous Q12H  . doxazosin  2 mg Oral Q breakfast  . feeding supplement (ENSURE ENLIVE)  237 mL Oral BID BM  . heparin  5,000 Units Subcutaneous 3 times per day  . lisinopril   40 mg Oral Daily  . meropenem (MERREM) IV  1 g Intravenous 3 times per day  . metoprolol tartrate  25 mg Oral BID  . pantoprazole (PROTONIX) IV  40 mg Intravenous QHS  . pneumococcal 23 valent vaccine  0.5 mL Intramuscular Tomorrow-1000  . potassium chloride SA  20 mEq Oral BID WC   Infusions: . sodium chloride 125 mL/hr at 12/22/14 0050   PRN Meds: budesonide-formoterol, hydrALAZINE, levalbuterol, methocarbamol (ROBAXIN)  IV, morphine injection, ondansetron (ZOFRAN) IV   Allergies as of 12/20/2014  . (No Known Allergies)    Family History  Problem Relation Age of Onset  . Throat cancer Mother     History   Social History  . Marital Status: Married    Spouse Name: N/A  . Number of Children: N/A  . Years of Education: N/A   Occupational History  . Not on file.   Social History Main Topics  . Smoking status: Never Smoker   . Smokeless tobacco: Not on file  . Alcohol Use: No  . Drug Use: No  . Sexual Activity: Not Currently   Other Topics Concern  . Not on file   Social History Narrative    REVIEW OF SYSTEMS: Constitutional:  Malaise. ENT:  No nose bleeds Pulm:  No shortness of breath, no cough. CV:  No palpitations, no LE edema. No chest pain GU:  No hematuria, no frequency GI:  Per hpi Heme:  No issues with excessive bleeding or bruising.   Transfusions:  None. Neuro:  No headaches, no peripheral tingling or numbness Derm:  No itching, no rash or sores.  Endocrine:  + chills and sweats..  No polyuria or dysuria Immunization:  Did not inquire. Travel:  None beyond local counties in last few months.    PHYSICAL EXAM: Vital signs in last 24 hours: Filed Vitals:   12/22/14 1200  BP: 159/92  Pulse:  109.   Temp:  98.7.   Resp: 16   Wt Readings from Last 3 Encounters:  12/20/14 295 lb 6.7 oz (134 kg)  11/25/14 305 lb 1.9 oz (138.4 kg)    General: Obese, looks chronically unwell. Currently comfortable. Head:  No facial asymmetry or edema.  Eyes:   Minor scleral icterus. No conjunctival pallor. Ears:  Slightly hard of hearing.  Nose:  No congestion or discharge. Mouth:  Dental health poor, absent teeth and caries evident. Mucous membranes moist and clear. Neck:  No JVD, no masses. No TMG. Lungs:  No labored breathing or cough. Lungs are clear Heart: Slightly tachycardia in the low 100s, rhythm is regular.Marland Kitchen No MRG. S1-S2 audible. Abdomen:  Soft, obese, minor epigastric tenderness. No guarding or rebound. No masses or HSM.Marland Kitchen   Rectal: Deferred   Musc/Skeltl: No joint swelling, erythema or contracture deformities. Extremities:  Bilateral 2+ pitting edema of the lower extremities. Feet are warm with good perfusion.  Neurologic:  Oriented 3. No limb weakness, no tremor. Skin:  No telangiectasia, no rashes.  No jaundice however the skin has a sallow appearance.   Intake/Output from previous day: 07/28 0701 - 07/29 0700 In: 2733.3 [P.O.:120; I.V.:2058.3; IV Piggyback:555] Out: 900 [Urine:900] Intake/Output this shift: Total I/O In: 990 [P.O.:480; IV Piggyback:510] Out: 1700 [Urine:1700]  LAB RESULTS:  Recent Labs  12/20/14 2032 12/22/14 0240  WBC 49.3* 16.7*  HGB 13.9 11.6*  HCT 40.6 34.9*  PLT 239 202   BMET Lab Results  Component Value Date   NA 134* 12/22/2014   NA 135 12/20/2014   NA 137 11/25/2014   K 3.9 12/22/2014  K 4.0 12/20/2014   K 4.2 11/25/2014   CL 105 12/22/2014   CL 102 12/20/2014   CL 106 11/25/2014   CO2 22 12/22/2014   CO2 19* 12/20/2014   CO2 25 11/25/2014   GLUCOSE 84 12/22/2014   GLUCOSE 123* 12/20/2014   GLUCOSE 89 11/25/2014   BUN 13 12/22/2014   BUN 17 12/20/2014   BUN 7 11/25/2014   CREATININE 0.76 12/22/2014   CREATININE 1.00 12/20/2014   CREATININE 0.74 11/25/2014   CALCIUM 8.5* 12/22/2014   CALCIUM 8.7* 12/20/2014   CALCIUM 8.5* 11/25/2014   LFT  Recent Labs  12/20/14 2032 12/22/14 0240  PROT 7.0 6.3*  ALBUMIN 2.7* 2.3*  AST 137* 74*  ALT 97* 72*  ALKPHOS 589* 499*   BILITOT 9.0* 3.3*   PT/INR Lab Results  Component Value Date   INR 1.29 12/21/2014   INR 1.15 11/23/2014   Hepatitis Panel No results for input(s): HEPBSAG, HCVAB, HEPAIGM, HEPBIGM in the last 72 hours. C-Diff No components found for: CDIFF Lipase     Component Value Date/Time   LIPASE 120* 12/22/2014 0240    Drugs of Abuse     Component Value Date/Time   LABOPIA POSITIVE* 12/21/2014 1152   COCAINSCRNUR NONE DETECTED 12/21/2014 1152   LABBENZ NONE DETECTED 12/21/2014 1152   AMPHETMU NONE DETECTED 12/21/2014 1152   THCU NONE DETECTED 12/21/2014 1152   LABBARB NONE DETECTED 12/21/2014 1152     RADIOLOGY STUDIES: Ct Abdomen Pelvis Wo Contrast  12/21/2014   CLINICAL DATA:  64 year old male with 1 week history of worsening left-sided abdominal pain.  EXAM: CT ABDOMEN AND PELVIS WITHOUT CONTRAST  TECHNIQUE: Multidetector CT imaging of the abdomen and pelvis was performed following the standard protocol without IV contrast.  COMPARISON:  CT of the abdomen and pelvis 11/22/2014.  FINDINGS: Lower chest: Linear scarring in the right lower lobe. Mild cardiomegaly.  Hepatobiliary: No no cystic or solid hepatic lesions are identified on today's noncontrast CT examination. Intermediate attenuation material layers dependently in the gallbladder, compatible with biliary sludge. Gallbladder wall appears mildly thickened measuring 6 mm. No definite pericholecystic fluid or stranding.  Pancreas: No pancreatic mass. Slight haziness in the peripancreatic fat. No peripancreatic fluid collections identified on today's noncontrast CT examination.  Spleen: Unremarkable.  Adrenals/Urinary Tract: Numerous large nonobstructive calculi are present within the left renal collecting system, measuring up to 9 mm in the lower pole. No additional calculi are identified along the course of either ureter. However, in the left side of the urinary bladder at or immediately beyond the left ureterovesicular junction there  is a 3 mm calculus (image 84 of series 2). Urinary bladder appears diffusely thickened. Bilateral adrenal glands are normal in appearance.  Stomach/Bowel: Postoperative changes in the stomach, suggestive of prior Roux-en-Y gastric bypass. No pathologic dilatation of small bowel or colon.  Vascular/Lymphatic: Atherosclerosis throughout the abdominal and pelvic vasculature, without evidence of aneurysm. Retroaortic left renal vein (normal anatomical variant) incidentally noted. No lymphadenopathy identified in the abdomen or pelvis on today's noncontrast CT examination.  Reproductive: Prostate gland and seminal vesicles are normal in appearance.  Other: No significant volume of ascites.  No pneumoperitoneum.  Musculoskeletal: Old compression fracture of the superior endplate of L1 with approximately 30% loss of anterior vertebral body height is unchanged. There are no aggressive appearing lytic or blastic lesions noted in the visualized portions of the skeleton.  IMPRESSION: 1. 3 mm calculus in the urinary bladder at or immediately beyond the left ureterovesicular junction. There  is no associated hydroureteronephrosis to indicate urinary tract obstruction at this time. Multiple additional large left-sided nonobstructive calculi, largest which measures 9 mm in the lower pole collecting system of the left kidney. 2. Slight haziness of the peripancreatic fat. Clinical correlation for signs and symptoms of acute pancreatitis is suggested. 3. Small amount of biliary sludge layering dependently in the gallbladder. Gallbladder wall appears thickened (6 mm). However, there is no pericholecystic fluid or surrounding inflammatory changes to strongly suggest an acute cholecystitis at this time. Clinical correlation is recommended. If there is concern for acute cholecystitis, further evaluation with right upper quadrant ultrasound would be recommended. 4. Mild diffuse thickening of the urinary bladder wall, poorly evaluated on  today's noncontrast CT examination. 5. Additional incidental findings, as above.   Electronically Signed   By: Vinnie Langton M.D.   On: 12/21/2014 15:14   Dg Chest 2 View  12/21/2014   CLINICAL DATA:  CHF.  EXAM: CHEST  2 VIEW  COMPARISON:  12/20/2014.  FINDINGS: The cardiac silhouette remains mildly enlarged. Clear lungs with normal vascularity. Unremarkable bones.  IMPRESSION: No acute abnormality.  Stable mild cardiomegaly.   Electronically Signed   By: Claudie Revering M.D.   On: 12/21/2014 15:08    ENDOSCOPIC STUDIES: None ever  IMPRESSION:   *  Recurrent acute pancreatitis. Suspected to have biliary pancreatitis at admission in June 2016, suspect biliary pancreatitis currently as well. Clinically and biochemically improved with addition of meropenem. Growing Escherichia coli from the blood, this is possibly a urinary source but have to be concerned about acute cholecystitis and cholangitis. Management, i.e. ERCP, complicated by his being status post Roux-en-Y gastric bypass. Plan had been for elective laparoscopic cholecystectomy with possible intraoperative ERCP via gastrostomy into the gastric remnant. Currently scheduled for the laparoscopic cholecystectomy tomorrow with Dr Hassell Done and is being transported to [facility] where surgery planned.     PLAN:     *  Dr. Fuller Plan will be in touch with Dr. Hassell Done regarding availability to perform intraoperative ERCP.   Azucena Freed  12/22/2014, 1:15 PM Pager: [phone]     Attending physician's note   I have taken a history, examined the patient and reviewed the chart. I agree with the Advanced Practitioner's note, impression and recommendations. Recurrent biliary pancreatitis. Possible cholecystitis, cholangitis or UTI leading to E coli bacteremia. Pt is S/P Roux-en-Y gastric bypass. IV antibiotics and clear liquids for now. I have discussed the patients care with Dr. Hassell Done. I have not performed ERCP via gastrostomy in OR so I am not comfortable  proceeding. I advised Dr. Hassell Done that we refer patients with Roux-en-Y who need ERCP to [facility] or [facility] as they have experience in this area. Dr. Benson Norway will assume care on Monday.   Ladene Artist, MD Marval Regal [phone] pager Mon-Fri 8a-5p [phone] weekends, holidays and 5p-8a or per [facility]

## 2014-12-22 NOTE — Progress Notes (Signed)
Report called to [facility] rn pt transferring to [facility] room 1420 via care link.

## 2014-12-22 NOTE — Progress Notes (Addendum)
Called dr Joseph Art regarding blood culture results positive for e.coli with different sensitivity and resistances.paperwork put in chart.

## 2014-12-22 NOTE — Progress Notes (Signed)
Patient ID: Brett Cline, male   DOB: December 12, 1950, 64 y.o.   MRN: [id number]    Subjective: Pt feels ok.  Still with some epigastric abdominal pain.    Objective: Vital signs in last 24 hours: Temp:  [98 F (36.7 C)-99.5 F (37.5 C)] 98.2 F (36.8 C) (07/29 0314) Pulse Rate:  [68-122] 69 (07/29 0645) Resp:  [0-21] 16 (07/29 0645) BP: (105-193)/(12-98) 182/98 mmHg (07/29 0645) SpO2:  [92 %-97 %] 97 % (07/29 0645) Last BM Date: 12/19/14  Intake/Output from previous day: 07/28 0701 - 07/29 0700 In: 2533.3 [P.O.:120; I.V.:2058.3; IV Piggyback:355] Out: 900 [Urine:900] Intake/Output this shift: Total I/O In: -  Out: 900 [Urine:900]  PE: Abd: soft, tender in epigastrium, +BS, obese Heart: regular Lungs: CTAB  Lab Results:   Recent Labs  12/20/14 2032 12/22/14 0240  WBC 49.3* 16.7*  HGB 13.9 11.6*  HCT 40.6 34.9*  PLT 239 202   BMET  Recent Labs  12/20/14 2032 12/22/14 0240  NA 135 134*  K 4.0 3.9  CL 102 105  CO2 19* 22  GLUCOSE 123* 84  BUN 17 13  CREATININE 1.00 0.76  CALCIUM 8.7* 8.5*   PT/INR  Recent Labs  12/21/14 1225  LABPROT 16.3*  INR 1.29   CMP     Component Value Date/Time   NA 134* 12/22/2014 0240   K 3.9 12/22/2014 0240   CL 105 12/22/2014 0240   CO2 22 12/22/2014 0240   GLUCOSE 84 12/22/2014 0240   BUN 13 12/22/2014 0240   CREATININE 0.76 12/22/2014 0240   CALCIUM 8.5* 12/22/2014 0240   PROT 6.3* 12/22/2014 0240   ALBUMIN 2.3* 12/22/2014 0240   AST 74* 12/22/2014 0240   ALT 72* 12/22/2014 0240   ALKPHOS 499* 12/22/2014 0240   BILITOT 3.3* 12/22/2014 0240   GFRNONAA >60 12/22/2014 0240   GFRAA >60 12/22/2014 0240   Lipase     Component Value Date/Time   LIPASE 120* 12/22/2014 0240       Studies/Results: Ct Abdomen Pelvis Wo Contrast  12/21/2014   CLINICAL DATA:  64 year old male with 1 week history of worsening left-sided abdominal pain.  EXAM: CT ABDOMEN AND PELVIS WITHOUT CONTRAST  TECHNIQUE: Multidetector CT  imaging of the abdomen and pelvis was performed following the standard protocol without IV contrast.  COMPARISON:  CT of the abdomen and pelvis 11/22/2014.  FINDINGS: Lower chest: Linear scarring in the right lower lobe. Mild cardiomegaly.  Hepatobiliary: No no cystic or solid hepatic lesions are identified on today's noncontrast CT examination. Intermediate attenuation material layers dependently in the gallbladder, compatible with biliary sludge. Gallbladder wall appears mildly thickened measuring 6 mm. No definite pericholecystic fluid or stranding.  Pancreas: No pancreatic mass. Slight haziness in the peripancreatic fat. No peripancreatic fluid collections identified on today's noncontrast CT examination.  Spleen: Unremarkable.  Adrenals/Urinary Tract: Numerous large nonobstructive calculi are present within the left renal collecting system, measuring up to 9 mm in the lower pole. No additional calculi are identified along the course of either ureter. However, in the left side of the urinary bladder at or immediately beyond the left ureterovesicular junction there is a 3 mm calculus (image 84 of series 2). Urinary bladder appears diffusely thickened. Bilateral adrenal glands are normal in appearance.  Stomach/Bowel: Postoperative changes in the stomach, suggestive of prior Roux-en-Y gastric bypass. No pathologic dilatation of small bowel or colon.  Vascular/Lymphatic: Atherosclerosis throughout the abdominal and pelvic vasculature, without evidence of aneurysm. Retroaortic left renal vein (normal anatomical  variant) incidentally noted. No lymphadenopathy identified in the abdomen or pelvis on today's noncontrast CT examination.  Reproductive: Prostate gland and seminal vesicles are normal in appearance.  Other: No significant volume of ascites.  No pneumoperitoneum.  Musculoskeletal: Old compression fracture of the superior endplate of L1 with approximately 30% loss of anterior vertebral body height is  unchanged. There are no aggressive appearing lytic or blastic lesions noted in the visualized portions of the skeleton.  IMPRESSION: 1. 3 mm calculus in the urinary bladder at or immediately beyond the left ureterovesicular junction. There is no associated hydroureteronephrosis to indicate urinary tract obstruction at this time. Multiple additional large left-sided nonobstructive calculi, largest which measures 9 mm in the lower pole collecting system of the left kidney. 2. Slight haziness of the peripancreatic fat. Clinical correlation for signs and symptoms of acute pancreatitis is suggested. 3. Small amount of biliary sludge layering dependently in the gallbladder. Gallbladder wall appears thickened (6 mm). However, there is no pericholecystic fluid or surrounding inflammatory changes to strongly suggest an acute cholecystitis at this time. Clinical correlation is recommended. If there is concern for acute cholecystitis, further evaluation with right upper quadrant ultrasound would be recommended. 4. Mild diffuse thickening of the urinary bladder wall, poorly evaluated on today's noncontrast CT examination. 5. Additional incidental findings, as above.   Electronically Signed   By: Trudie Reed M.D.   On: 12/21/2014 15:14   Dg Chest 2 View  12/21/2014   CLINICAL DATA:  CHF.  EXAM: CHEST  2 VIEW  COMPARISON:  12/20/2014.  FINDINGS: The cardiac silhouette remains mildly enlarged. Clear lungs with normal vascularity. Unremarkable bones.  IMPRESSION: No acute abnormality.  Stable mild cardiomegaly.   Electronically Signed   By: Beckie Salts M.D.   On: 12/21/2014 15:08    Anti-infectives: Anti-infectives    Start     Dose/Rate Route Frequency Ordered Stop   12/20/14 2200  meropenem (MERREM) 1 g in sodium chloride 0.9 % 100 mL IVPB  Status:  Discontinued     1 g 200 mL/hr over 30 Minutes Intravenous Every 12 hours 12/20/14 2122 12/20/14 2155   12/20/14 2200  ciprofloxacin (CIPRO) IVPB 400 mg     400  mg 200 mL/hr over 60 Minutes Intravenous Every 12 hours 12/20/14 2122     12/20/14 2200  meropenem (MERREM) 1 g in sodium chloride 0.9 % 100 mL IVPB     1 g 200 mL/hr over 30 Minutes Intravenous 3 times per day 12/20/14 2155         Assessment/Plan  1. Gallstone pancreatitis with choledocholithiasis -TB down to 3.3 today from 9.0.  The patient may have passed his CBD stone. -WBC down to 16.7K from 49K as well -we have d/w Dr. Daphine Deutscher who is agreeable to perform lap chole with possible common duct exploration lap vs open. -the patient will need to be transferred to [facility] as Dr. Daphine Deutscher is on call over there this weekend.  I'm trying to contact the primary service to discuss this with them. -cont abx therapy -NPO p MN    LOS: 2 days    Nishaan Stanke E 12/22/2014, 7:46 AM Pager: [phone]

## 2014-12-23 DIAGNOSIS — D72829 Elevated white blood cell count, unspecified: Secondary | ICD-10-CM

## 2014-12-23 DIAGNOSIS — I4892 Unspecified atrial flutter: Secondary | ICD-10-CM

## 2014-12-23 LAB — COMPREHENSIVE METABOLIC PANEL
ALBUMIN: 2.8 g/dL — AB (ref 3.5–5.0)
ALT: 53 U/L (ref 17–63)
ANION GAP: 9 (ref 5–15)
AST: 49 U/L — AB (ref 15–41)
Alkaline Phosphatase: 503 U/L — ABNORMAL HIGH (ref 38–126)
BILIRUBIN TOTAL: 2.6 mg/dL — AB (ref 0.3–1.2)
BUN: 7 mg/dL (ref 6–20)
CALCIUM: 8.5 mg/dL — AB (ref 8.9–10.3)
CO2: 24 mmol/L (ref 22–32)
CREATININE: 0.59 mg/dL — AB (ref 0.61–1.24)
Chloride: 102 mmol/L (ref 101–111)
GFR calc Af Amer: >60 mL/min (ref >60)
Glucose, Bld: 79 mg/dL (ref 65–99)
Potassium: 3.6 mmol/L (ref 3.5–5.1)
Sodium: 135 mmol/L (ref 135–145)
Total Protein: 7.1 g/dL (ref 6.5–8.1)

## 2014-12-23 LAB — CBC WITH DIFFERENTIAL/PLATELET
BASOS ABS: 0.1 10*3/uL (ref 0.0–0.1)
BASOS PCT: 1 % (ref 0–1)
EOS ABS: 0.2 10*3/uL (ref 0.0–0.7)
EOS PCT: 2 % (ref 0–5)
HCT: 37.4 % — ABNORMAL LOW (ref 39.0–52.0)
Hemoglobin: 12 g/dL — ABNORMAL LOW (ref 13.0–17.0)
Lymphocytes Relative: 15 % (ref 12–46)
Lymphs Abs: 1.3 10*3/uL (ref 0.7–4.0)
MCH: 28.5 pg (ref 26.0–34.0)
MCHC: 32.1 g/dL (ref 30.0–36.0)
MCV: 88.8 fL (ref 78.0–100.0)
Monocytes Absolute: 0.7 10*3/uL (ref 0.1–1.0)
Monocytes Relative: 9 % (ref 3–12)
NEUTROS PCT: 73 % (ref 43–77)
Neutro Abs: 6.3 10*3/uL (ref 1.7–7.7)
PLATELETS: 209 10*3/uL (ref 150–400)
RBC: 4.21 MIL/uL — ABNORMAL LOW (ref 4.22–5.81)
RDW: 15.9 % — AB (ref 11.5–15.5)
WBC: 8.6 10*3/uL (ref 4.0–10.5)

## 2014-12-23 LAB — LIPASE, BLOOD: LIPASE: 82 U/L — AB (ref 22–51)

## 2014-12-23 LAB — MAGNESIUM: Magnesium: 1.8 mg/dL (ref 1.7–2.4)

## 2014-12-23 MED ORDER — BUPROPION HCL ER (XL) 150 MG PO TB24
150.0000 mg | ORAL_TABLET | Freq: Every day | ORAL | Status: DC
  Administered 2014-12-23 – 2014-12-29 (×7): 150 mg via ORAL
  Filled 2014-12-23 (×7): qty 1

## 2014-12-23 MED ORDER — AMITRIPTYLINE HCL 25 MG PO TABS
25.0000 mg | ORAL_TABLET | Freq: Three times a day (TID) | ORAL | Status: DC
  Administered 2014-12-23 – 2014-12-29 (×17): 25 mg via ORAL
  Filled 2014-12-23 (×19): qty 1

## 2014-12-23 MED ORDER — METOPROLOL TARTRATE 50 MG PO TABS
50.0000 mg | ORAL_TABLET | Freq: Two times a day (BID) | ORAL | Status: DC
  Administered 2014-12-23 – 2014-12-29 (×12): 50 mg via ORAL
  Filled 2014-12-23 (×12): qty 1

## 2014-12-23 MED ORDER — ESCITALOPRAM OXALATE 10 MG PO TABS
10.0000 mg | ORAL_TABLET | Freq: Three times a day (TID) | ORAL | Status: DC
  Administered 2014-12-23 – 2014-12-29 (×17): 10 mg via ORAL
  Filled 2014-12-23 (×22): qty 1

## 2014-12-23 MED ORDER — ACETAMINOPHEN 325 MG PO TABS
650.0000 mg | ORAL_TABLET | ORAL | Status: DC | PRN
  Administered 2014-12-23 – 2014-12-24 (×2): 650 mg via ORAL
  Filled 2014-12-23 (×2): qty 2

## 2014-12-23 MED ORDER — METOPROLOL TARTRATE 1 MG/ML IV SOLN
INTRAVENOUS | Status: AC
  Filled 2014-12-23: qty 5

## 2014-12-23 MED ORDER — ENOXAPARIN SODIUM 150 MG/ML ~~LOC~~ SOLN
1.0000 mg/kg | Freq: Two times a day (BID) | SUBCUTANEOUS | Status: DC
  Administered 2014-12-23 – 2014-12-24 (×2): 135 mg via SUBCUTANEOUS
  Filled 2014-12-23 (×3): qty 1

## 2014-12-23 MED ORDER — METOPROLOL TARTRATE 1 MG/ML IV SOLN
5.0000 mg | INTRAVENOUS | Status: AC
  Administered 2014-12-23: 5 mg via INTRAVENOUS

## 2014-12-23 NOTE — Progress Notes (Signed)
Gastroenterology Progress Note Covering for Dr. Elnoria Howard  Subjective:  Still with some epigastric pain. Tol clears.    Objective:  Vital signs in last 24 hours: Temp:  [97.6 F (36.4 C)-98.7 F (37.1 C)] 97.6 F (36.4 C) (07/30 0538) Pulse Rate:  [66-75] 66 (07/30 0538) Resp:  [14-20] 14 (07/30 0538) BP: (159-207)/(85-113) 174/91 mmHg (07/30 0818) SpO2:  [99 %] 99 % (07/30 0538) Last BM Date: 12/22/14 General:   Alert,  Well-developed,    in NAD Heart:  Regular rate and rhythm Pulm;lungs clear Abdomen:  Soft, , tender epigastric area,nondistended. Normal bowel sounds  Extremities:  2+ edema LE Neurologic: Alert and  oriented x4;  grossly normal neurologically. Psych:  Alert and cooperative. Normal mood and affect.  Intake/Output from previous day: 07/29 0701 - 07/30 0700 In: 2979 [P.O.:480; I.V.:1989; IV Piggyback:510] Out: 6350 [Urine:6350] Intake/Output this shift: Total I/O In: -  Out: 1000 [Urine:1000]  Lab Results:  Recent Labs  12/20/14 2032 12/22/14 0240 12/23/14 0510  WBC 49.3* 16.7* 8.6  HGB 13.9 11.6* 12.0*  HCT 40.6 34.9* 37.4*  PLT 239 202 209   BMET  Recent Labs  12/20/14 2032 12/22/14 0240 12/23/14 0510  NA 135 134* 135  K 4.0 3.9 3.6  CL 102 105 102  CO2 19* 22 24  GLUCOSE 123* 84 79  BUN 17 13 7   CREATININE 1.00 0.76 0.59*  CALCIUM 8.7* 8.5* 8.5*   LFT  Recent Labs  12/23/14 0510  PROT 7.1  ALBUMIN 2.8*  AST 49*  ALT 53  ALKPHOS 503*  BILITOT 2.6*   PT/INR  Recent Labs  12/21/14 1225  LABPROT 16.3*  INR 1.29   Hepatitis Panel No results for input(s): HEPBSAG, HCVAB, HEPAIGM, HEPBIGM in the last 72 hours.  Ct Abdomen Pelvis Wo Contrast  12/21/2014   CLINICAL DATA:  64 year old male with 1 week history of worsening left-sided abdominal pain.  EXAM: CT ABDOMEN AND PELVIS WITHOUT CONTRAST  TECHNIQUE: Multidetector CT imaging of the abdomen and pelvis was performed following the standard protocol without IV contrast.   COMPARISON:  CT of the abdomen and pelvis 11/22/2014.  FINDINGS: Lower chest: Linear scarring in the right lower lobe. Mild cardiomegaly.  Hepatobiliary: No no cystic or solid hepatic lesions are identified on today's noncontrast CT examination. Intermediate attenuation material layers dependently in the gallbladder, compatible with biliary sludge. Gallbladder wall appears mildly thickened measuring 6 mm. No definite pericholecystic fluid or stranding.  Pancreas: No pancreatic mass. Slight haziness in the peripancreatic fat. No peripancreatic fluid collections identified on today's noncontrast CT examination.  Spleen: Unremarkable.  Adrenals/Urinary Tract: Numerous large nonobstructive calculi are present within the left renal collecting system, measuring up to 9 mm in the lower pole. No additional calculi are identified along the course of either ureter. However, in the left side of the urinary bladder at or immediately beyond the left ureterovesicular junction there is a 3 mm calculus (image 84 of series 2). Urinary bladder appears diffusely thickened. Bilateral adrenal glands are normal in appearance.  Stomach/Bowel: Postoperative changes in the stomach, suggestive of prior Roux-en-Y gastric bypass. No pathologic dilatation of small bowel or colon.  Vascular/Lymphatic: Atherosclerosis throughout the abdominal and pelvic vasculature, without evidence of aneurysm. Retroaortic left renal vein (normal anatomical variant) incidentally noted. No lymphadenopathy identified in the abdomen or pelvis on today's noncontrast CT examination.  Reproductive: Prostate gland and seminal vesicles are normal in appearance.  Other: No significant volume of ascites.  No pneumoperitoneum.  Musculoskeletal: Old compression  fracture of the superior endplate of L1 with approximately 30% loss of anterior vertebral body height is unchanged. There are no aggressive appearing lytic or blastic lesions noted in the visualized portions of the  skeleton.  IMPRESSION: 1. 3 mm calculus in the urinary bladder at or immediately beyond the left ureterovesicular junction. There is no associated hydroureteronephrosis to indicate urinary tract obstruction at this time. Multiple additional large left-sided nonobstructive calculi, largest which measures 9 mm in the lower pole collecting system of the left kidney. 2. Slight haziness of the peripancreatic fat. Clinical correlation for signs and symptoms of acute pancreatitis is suggested. 3. Small amount of biliary sludge layering dependently in the gallbladder. Gallbladder wall appears thickened (6 mm). However, there is no pericholecystic fluid or surrounding inflammatory changes to strongly suggest an acute cholecystitis at this time. Clinical correlation is recommended. If there is concern for acute cholecystitis, further evaluation with right upper quadrant ultrasound would be recommended. 4. Mild diffuse thickening of the urinary bladder wall, poorly evaluated on today's noncontrast CT examination. 5. Additional incidental findings, as above.   Electronically Signed   By: Trudie Reed M.D.   On: 12/21/2014 15:14   Dg Chest 2 View  12/21/2014   CLINICAL DATA:  CHF.  EXAM: CHEST  2 VIEW  COMPARISON:  12/20/2014.  FINDINGS: The cardiac silhouette remains mildly enlarged. Clear lungs with normal vascularity. Unremarkable bones.  IMPRESSION: No acute abnormality.  Stable mild cardiomegaly.   Electronically Signed   By: Beckie Salts M.D.   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 Suspected to have biliary pancreatitis at admission in June 2016, suspect biliary pancreatitis currently as well. Clinically and biochemically improved with addition of meropenem. Growing Escherichia coli from the blood, this is possibly a urinary source but have to be concerned about acute cholecystitis and cholangitis.. Pt is S/P Roux-en-Y gastric bypass. IV antibiotics and clear liquids  for now. Dr. Elnoria Howard will assume care on Monday.     LOS: 3 days   Hvozdovic, Moise Boring 12/23/2014, Pager [phone]     Attending physician's note   I have taken an interval history, reviewed the chart and examined the patient. I agree with the Advanced Practitioner's note, impression and recommendations. Continue IV antibiotics. LFTs markedly improved. WBC markedly improved. Dr. Elnoria Howard to discuss mgmt plans with Dr. Daphine Deutscher on Monday. Will be available if needed for questions/problems.   Venita Lick. Russella Dar, MD [facility] [phone] pager Mon-Fri 8a-5p [phone] weekends, holidays and 5p-8a or per [facility] ([facility])

## 2014-12-23 NOTE — Progress Notes (Signed)
Pt HR sustaining in the 180's while sitting on the side of the bed.  BP 194/91 after hydralazine already given. Pt stating that his chest has been feeing tight on and off since Tuesday.  EKG preformed. MD made aware.  Will continue to monitor closely.

## 2014-12-23 NOTE — Progress Notes (Signed)
Patient ID: Brett Cline, male   DOB: 27-Oct-1950, 64 y.o.   MRN: [id number] [facility] Surgery Progress Note:   * No surgery found *  Subjective: Mental status is clear. Objective: Vital signs in last 24 hours: Temp:  [97.6 F (36.4 C)-98.7 F (37.1 C)] 97.6 F (36.4 C) (07/30 0538) Pulse Rate:  [66-100] 100 (07/30 0931) Resp:  [14-20] 14 (07/30 0538) BP: (174-207)/(89-113) 174/91 mmHg (07/30 0818) SpO2:  [99 %] 99 % (07/30 0538)  Intake/Output from previous day: 07/29 0701 - 07/30 0700 In: 2979 [P.O.:480; I.V.:1989; IV Piggyback:510] Out: 5284 [Urine:6350] Intake/Output this shift: Total I/O In: -  Out: 1000 [Urine:1000]  Physical Exam: Work of breathing is not labored.    Lab Results:  Results for orders placed or performed during the hospital encounter of 12/20/14 (from the past 48 hour(s))  Troponin I (q 6hr x 3)     Status: Abnormal   Collection Time: 12/21/14  6:10 PM  Result Value Ref Range   Troponin I 0.10 (H) <0.031 ng/mL    Comment:        PERSISTENTLY INCREASED TROPONIN VALUES IN THE RANGE OF 0.04-0.49 ng/mL CAN BE SEEN IN:       -UNSTABLE ANGINA       -CONGESTIVE HEART FAILURE       -MYOCARDITIS       -CHEST TRAUMA       -ARRYHTHMIAS       -LATE PRESENTING MYOCARDIAL INFARCTION       -COPD   CLINICAL FOLLOW-UP RECOMMENDED.   Troponin I (q 6hr x 3)     Status: None   Collection Time: 12/21/14 11:50 PM  Result Value Ref Range   Troponin I <0.03 <0.031 ng/mL    Comment:        NO INDICATION OF MYOCARDIAL INJURY.   Comprehensive metabolic panel     Status: Abnormal   Collection Time: 12/22/14  2:40 AM  Result Value Ref Range   Sodium 134 (L) 135 - 145 mmol/L   Potassium 3.9 3.5 - 5.1 mmol/L   Chloride 105 101 - 111 mmol/L   CO2 22 22 - 32 mmol/L   Glucose, Bld 84 65 - 99 mg/dL   BUN 13 6 - 20 mg/dL   Creatinine, Ser 0.76 0.61 - 1.24 mg/dL   Calcium 8.5 (L) 8.9 - 10.3 mg/dL   Total Protein 6.3 (L) 6.5 - 8.1 g/dL   Albumin 2.3 (L) 3.5 -  5.0 g/dL   AST 74 (H) 15 - 41 U/L   ALT 72 (H) 17 - 63 U/L   Alkaline Phosphatase 499 (H) 38 - 126 U/L   Total Bilirubin 3.3 (H) 0.3 - 1.2 mg/dL    Comment: DELTA CHECK NOTED   GFR calc non Af Amer >60 >60 mL/min   GFR calc Af Amer >60 >60 mL/min    Comment: (NOTE) The eGFR has been calculated using the CKD EPI equation. This calculation has not been validated in all clinical situations. eGFR's persistently <60 mL/min signify possible Chronic Kidney Disease.    Anion gap 7 5 - 15  CBC with Differential/Platelet     Status: Abnormal   Collection Time: 12/22/14  2:40 AM  Result Value Ref Range   WBC 16.7 (H) 4.0 - 10.5 K/uL   RBC 3.88 (L) 4.22 - 5.81 MIL/uL   Hemoglobin 11.6 (L) 13.0 - 17.0 g/dL   HCT 34.9 (L) 39.0 - 52.0 %   MCV 89.9 78.0 - 100.0 fL  MCH 29.9 26.0 - 34.0 pg   MCHC 33.2 30.0 - 36.0 g/dL   RDW 16.3 (H) 11.5 - 15.5 %   Platelets 202 150 - 400 K/uL   Neutrophils Relative % 89 (H) 43 - 77 %   Neutro Abs 14.7 (H) 1.7 - 7.7 K/uL   Lymphocytes Relative 6 (L) 12 - 46 %   Lymphs Abs 1.0 0.7 - 4.0 K/uL   Monocytes Relative 5 3 - 12 %   Monocytes Absolute 0.9 0.1 - 1.0 K/uL   Eosinophils Relative 0 0 - 5 %   Eosinophils Absolute 0.1 0.0 - 0.7 K/uL   Basophils Relative 0 0 - 1 %   Basophils Absolute 0.0 0.0 - 0.1 K/uL  Magnesium     Status: None   Collection Time: 12/22/14  2:40 AM  Result Value Ref Range   Magnesium 1.9 1.7 - 2.4 mg/dL  Lipase, blood     Status: Abnormal   Collection Time: 12/22/14  2:40 AM  Result Value Ref Range   Lipase 120 (H) 22 - 51 U/L  Comprehensive metabolic panel     Status: Abnormal   Collection Time: 12/23/14  5:10 AM  Result Value Ref Range   Sodium 135 135 - 145 mmol/L   Potassium 3.6 3.5 - 5.1 mmol/L   Chloride 102 101 - 111 mmol/L   CO2 24 22 - 32 mmol/L   Glucose, Bld 79 65 - 99 mg/dL   BUN 7 6 - 20 mg/dL   Creatinine, Ser 0.59 (L) 0.61 - 1.24 mg/dL   Calcium 8.5 (L) 8.9 - 10.3 mg/dL   Total Protein 7.1 6.5 - 8.1 g/dL    Albumin 2.8 (L) 3.5 - 5.0 g/dL   AST 49 (H) 15 - 41 U/L   ALT 53 17 - 63 U/L   Alkaline Phosphatase 503 (H) 38 - 126 U/L   Total Bilirubin 2.6 (H) 0.3 - 1.2 mg/dL   GFR calc non Af Amer >60 >60 mL/min   GFR calc Af Amer >60 >60 mL/min    Comment: (NOTE) The eGFR has been calculated using the CKD EPI equation. This calculation has not been validated in all clinical situations. eGFR's persistently <60 mL/min signify possible Chronic Kidney Disease.    Anion gap 9 5 - 15  CBC with Differential/Platelet     Status: Abnormal   Collection Time: 12/23/14  5:10 AM  Result Value Ref Range   WBC 8.6 4.0 - 10.5 K/uL   RBC 4.21 (L) 4.22 - 5.81 MIL/uL   Hemoglobin 12.0 (L) 13.0 - 17.0 g/dL   HCT 37.4 (L) 39.0 - 52.0 %   MCV 88.8 78.0 - 100.0 fL   MCH 28.5 26.0 - 34.0 pg   MCHC 32.1 30.0 - 36.0 g/dL   RDW 15.9 (H) 11.5 - 15.5 %   Platelets 209 150 - 400 K/uL   Neutrophils Relative % 73 43 - 77 %   Neutro Abs 6.3 1.7 - 7.7 K/uL   Lymphocytes Relative 15 12 - 46 %   Lymphs Abs 1.3 0.7 - 4.0 K/uL   Monocytes Relative 9 3 - 12 %   Monocytes Absolute 0.7 0.1 - 1.0 K/uL   Eosinophils Relative 2 0 - 5 %   Eosinophils Absolute 0.2 0.0 - 0.7 K/uL   Basophils Relative 1 0 - 1 %   Basophils Absolute 0.1 0.0 - 0.1 K/uL  Magnesium     Status: None   Collection Time: 12/23/14  5:10 AM  Result Value Ref Range   Magnesium 1.8 1.7 - 2.4 mg/dL  Lipase, blood     Status: Abnormal   Collection Time: 12/23/14  5:10 AM  Result Value Ref Range   Lipase 82 (H) 22 - 51 U/L    Radiology/Results: Ct Abdomen Pelvis Wo Contrast  12/21/2014   CLINICAL DATA:  64 year old male with 1 week history of worsening left-sided abdominal pain.  EXAM: CT ABDOMEN AND PELVIS WITHOUT CONTRAST  TECHNIQUE: Multidetector CT imaging of the abdomen and pelvis was performed following the standard protocol without IV contrast.  COMPARISON:  CT of the abdomen and pelvis 11/22/2014.  FINDINGS: Lower chest: Linear scarring in the right  lower lobe. Mild cardiomegaly.  Hepatobiliary: No no cystic or solid hepatic lesions are identified on today's noncontrast CT examination. Intermediate attenuation material layers dependently in the gallbladder, compatible with biliary sludge. Gallbladder wall appears mildly thickened measuring 6 mm. No definite pericholecystic fluid or stranding.  Pancreas: No pancreatic mass. Slight haziness in the peripancreatic fat. No peripancreatic fluid collections identified on today's noncontrast CT examination.  Spleen: Unremarkable.  Adrenals/Urinary Tract: Numerous large nonobstructive calculi are present within the left renal collecting system, measuring up to 9 mm in the lower pole. No additional calculi are identified along the course of either ureter. However, in the left side of the urinary bladder at or immediately beyond the left ureterovesicular junction there is a 3 mm calculus (image 84 of series 2). Urinary bladder appears diffusely thickened. Bilateral adrenal glands are normal in appearance.  Stomach/Bowel: Postoperative changes in the stomach, suggestive of prior Roux-en-Y gastric bypass. No pathologic dilatation of small bowel or colon.  Vascular/Lymphatic: Atherosclerosis throughout the abdominal and pelvic vasculature, without evidence of aneurysm. Retroaortic left renal vein (normal anatomical variant) incidentally noted. No lymphadenopathy identified in the abdomen or pelvis on today's noncontrast CT examination.  Reproductive: Prostate gland and seminal vesicles are normal in appearance.  Other: No significant volume of ascites.  No pneumoperitoneum.  Musculoskeletal: Old compression fracture of the superior endplate of L1 with approximately 30% loss of anterior vertebral body height is unchanged. There are no aggressive appearing lytic or blastic lesions noted in the visualized portions of the skeleton.  IMPRESSION: 1. 3 mm calculus in the urinary bladder at or immediately beyond the left  ureterovesicular junction. There is no associated hydroureteronephrosis to indicate urinary tract obstruction at this time. Multiple additional large left-sided nonobstructive calculi, largest which measures 9 mm in the lower pole collecting system of the left kidney. 2. Slight haziness of the peripancreatic fat. Clinical correlation for signs and symptoms of acute pancreatitis is suggested. 3. Small amount of biliary sludge layering dependently in the gallbladder. Gallbladder wall appears thickened (6 mm). However, there is no pericholecystic fluid or surrounding inflammatory changes to strongly suggest an acute cholecystitis at this time. Clinical correlation is recommended. If there is concern for acute cholecystitis, further evaluation with right upper quadrant ultrasound would be recommended. 4. Mild diffuse thickening of the urinary bladder wall, poorly evaluated on today's noncontrast CT examination. 5. Additional incidental findings, as above.   Electronically Signed   By: Vinnie Langton M.D.   On: 12/21/2014 15:14   Dg Chest 2 View  12/21/2014   CLINICAL DATA:  CHF.  EXAM: CHEST  2 VIEW  COMPARISON:  12/20/2014.  FINDINGS: The cardiac silhouette remains mildly enlarged. Clear lungs with normal vascularity. Unremarkable bones.  IMPRESSION: No acute abnormality.  Stable mild cardiomegaly.   Electronically Signed   By: Remo Lipps  Joneen Caraway M.D.   On: 12/21/2014 15:08    Anti-infectives: Anti-infectives    Start     Dose/Rate Route Frequency Ordered Stop   12/20/14 2200  meropenem (MERREM) 1 g in sodium chloride 0.9 % 100 mL IVPB  Status:  Discontinued     1 g 200 mL/hr over 30 Minutes Intravenous Every 12 hours 12/20/14 2122 12/20/14 2155   12/20/14 2200  ciprofloxacin (CIPRO) IVPB 400 mg  Status:  Discontinued     400 mg 200 mL/hr over 60 Minutes Intravenous Every 12 hours 12/20/14 2122 12/22/14 1327   12/20/14 2200  meropenem (MERREM) 1 g in sodium chloride 0.9 % 100 mL IVPB     1 g 200 mL/hr over  30 Minutes Intravenous 3 times per day 12/20/14 2155        Assessment/Plan: Problem List: Patient Active Problem List   Diagnosis Date Noted  . Malnutrition of moderate degree 12/22/2014  . Recurrent acute pancreatitis   . Congestive heart disease   . Essential hypertension   . Atrial fibrillation, unspecified   . Asthma with acute exacerbation   . Emphysema of lung   . Other emphysema   . Acute abdominal pain   . Cholelithiasis without obstruction   . Acute gallstone pancreatitis 12/20/2014  . Choledocholithiasis 11/22/2014  . Pancreatitis, acute 11/22/2014  . Leukocytosis 11/22/2014  . HTN (hypertension), benign 11/22/2014  . Morbid obesity 11/22/2014  . Depression 11/22/2014  . Hypokalemia 11/22/2014  . Chronic back pain 11/22/2014  . Right foot drop 11/22/2014  . Asthma, chronic 11/22/2014    Awaiting ERCP support next week to clear CBD at the time of surgery (lap chole) * No surgery found *    LOS: 3 days   Matt B. Hassell Done, MD, [facility] [phone] beeper [phone]  12/23/2014 12:43 PM

## 2014-12-23 NOTE — Consult Note (Addendum)
[facility] Consult Note   RYOMA NOFZIGER WUJ:[id number] DOB: 1951-04-02 DOA: 12/20/2014 PCP: Noni Saupe., MD  Brief narrative: CLARE CASTO is a 64 y.o. 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 At the end of June, was transferred from [place] to [facility] for gallstone pancreatitis then discharged a few days later for follow-up with general surgery. General surgery was to call him for an appt.  Subjective: No complaints today.   Assessment/Plan: Principal Problem:   Acute gallstone pancreatitis - recurrent cholelithiasis/choledocholithiasis/Sepsis  - pancreatitis resolved- advance to low fat diet- stop IVF - the patient was sent from [facility] to [place] [facility] yesterday for bariatric surgeons to perform cholecystectomy - surgery to perform cholecystectomy with concurrent ERCP by GI - Dr Elnoria Howard to return on Monday  Active Problems: - a-flutter RVR- new issue -he has no history of this in the past - had RVR this AM-HR improved after IV Lopressor- converted back to sinus rhythm later in the morning - will increase Lopressor to 50 BID from 25 BID - ECHO mentioned below- will start Lovenox for stroke prophylaxis for now which can be held prior to surgery  Bacteremia Escherichia coli - cultures done at [facility] -Organism pansensitive-  on Meropenam   BPH - cont doxazosin   Morbid obesity -Status post post-gastric bypass-   Depression - will resume Elavil, Lexapro, Wellbutrin   Chronic back pain -cont Robaxin- Norco on hold-being given Morphine IV-    Right foot drop -PT eval   Asthma, chronic -Stable-continue Symbicort and PRN Xopenex   Appt with PCP: Code Status: full code Family  Communication:  Disposition Plan: per surgery DVT prophylaxis: full dose lovenox Consultants:GI Procedures: ECHO: Impressions:  - Technically difficult; definity used; normal LV function; mild LVH; grade 1 diastolic dysfunction; moderate LAE.  Antibiotics: Anti-infectives    Start     Dose/Rate Route Frequency Ordered Stop   12/20/14 2200  meropenem (MERREM) 1 g in sodium chloride 0.9 % 100 mL IVPB  Status:  Discontinued     1 g 200 mL/hr over 30 Minutes Intravenous Every 12 hours 12/20/14 2122 12/20/14 2155   12/20/14 2200  ciprofloxacin (CIPRO) IVPB 400 mg  Status:  Discontinued     400 mg 200 mL/hr over 60 Minutes Intravenous Every 12 hours 12/20/14 2122 12/22/14 1327   12/20/14 2200  meropenem (MERREM) 1 g in sodium chloride 0.9 % 100 mL IVPB     1 g 200 mL/hr over 30 Minutes Intravenous 3 times per day 12/20/14 2155        Objective: Filed Weights   12/20/14 1710  Weight: 134 kg (295 lb 6.7 oz)    Intake/Output Summary (Last 24 hours) at 12/23/14 1330 Last data filed at 12/23/14 0816  Gross per 24 hour  Intake   1989 ml  Output   5650 ml  Net  -3661 ml     Vitals Filed Vitals:   12/23/14 0538 12/23/14 0818 12/23/14 0931 12/23/14 1311  BP: 207/113 174/91  184/91  Pulse: 66  100 78  Temp: 97.6 F (36.4 C)   98.1 F (36.7 C)  TempSrc: Oral   Oral  Resp: 14   18  Height:      Weight:  SpO2: 99%   99%    Exam:  General:  Pt is alert, not in acute distress  HEENT: No icterus, No thrush, oral mucosa moist  Cardiovascular: regular rate and rhythm, S1/S2 No murmur  Respiratory: clear to auscultation bilaterally   Abdomen: Soft, +Bowel sounds, non tender, non distended, no guarding  MSK: No LE edema, cyanosis or clubbing  Data Reviewed: Basic Metabolic Panel:  Recent Labs Lab 12/20/14 2032 12/22/14 0240 12/23/14 0510  NA 135 134* 135  K 4.0 3.9 3.6  CL 102 105 102  CO2 19* 22 24  GLUCOSE 123* 84 79  BUN 17 13 7   CREATININE 1.00 0.76  0.59*  CALCIUM 8.7* 8.5* 8.5*  MG 1.7 1.9 1.8  PHOS 4.8*  --   --    Liver Function Tests:  Recent Labs Lab 12/20/14 2032 12/22/14 0240 12/23/14 0510  AST 137* 74* 49*  ALT 97* 72* 53  ALKPHOS 589* 499* 503*  BILITOT 9.0* 3.3* 2.6*  PROT 7.0 6.3* 7.1  ALBUMIN 2.7* 2.3* 2.8*    Recent Labs Lab 12/20/14 2032 12/22/14 0240 12/23/14 0510  LIPASE 696* 120* 82*   No results for input(s): AMMONIA in the last 168 hours. CBC:  Recent Labs Lab 12/20/14 2032 12/22/14 0240 12/23/14 0510  WBC 49.3* 16.7* 8.6  NEUTROABS 46.8* 14.7* 6.3  HGB 13.9 11.6* 12.0*  HCT 40.6 34.9* 37.4*  MCV 87.9 89.9 88.8  PLT 239 202 209   Cardiac Enzymes:  Recent Labs Lab 12/21/14 1133 12/21/14 1810 12/21/14 2350  TROPONINI 0.23* 0.10* <0.03   BNP (last 3 results)  Recent Labs  12/21/14 1133  BNP 308.8*    ProBNP (last 3 results) No results for input(s): PROBNP in the last 8760 hours.  CBG: No results for input(s): GLUCAP in the last 168 hours.  Recent Results (from the past 240 hour(s))  MRSA PCR Screening     Status: None   Collection Time: 12/20/14  5:10 PM  Result Value Ref Range Status   MRSA by PCR NEGATIVE NEGATIVE Final    Comment:        The GeneXpert MRSA Assay (FDA approved for NASAL specimens only), is one component of a comprehensive MRSA colonization surveillance program. It is not intended to diagnose MRSA infection nor to guide or monitor treatment for MRSA infections.   Culture, blood (routine x 2)     Status: None (Preliminary result)   Collection Time: 12/21/14 11:33 AM  Result Value Ref Range Status   Specimen Description BLOOD RIGHT HAND  Final   Special Requests BOTTLES DRAWN AEROBIC ONLY 6CC  Final   Culture NO GROWTH 2 DAYS  Final   Report Status PENDING  Incomplete  Culture, blood (routine x 2)     Status: None (Preliminary result)   Collection Time: 12/21/14 11:45 AM  Result Value Ref Range Status   Specimen Description BLOOD RIGHT HAND   Final   Special Requests BOTTLES DRAWN AEROBIC ONLY 10CC  Final   Culture NO GROWTH 2 DAYS  Final   Report Status PENDING  Incomplete  Culture, Urine     Status: None   Collection Time: 12/21/14 11:52 AM  Result Value Ref Range Status   Specimen Description URINE, CLEAN CATCH  Final   Special Requests NONE  Final   Culture NO GROWTH 1 DAY  Final   Report Status 12/22/2014 FINAL  Final     Studies: Ct Abdomen Pelvis Wo Contrast  12/21/2014   CLINICAL DATA:  64 year old male with 1 week history of worsening left-sided abdominal pain.  EXAM: CT ABDOMEN AND PELVIS WITHOUT CONTRAST  TECHNIQUE: Multidetector CT imaging of the abdomen and pelvis was performed following the standard protocol without IV contrast.  COMPARISON:  CT of the abdomen and pelvis 11/22/2014.  FINDINGS: Lower chest: Linear scarring in the right lower lobe. Mild cardiomegaly.  Hepatobiliary: No no cystic or solid hepatic lesions are identified on today's noncontrast CT examination. Intermediate attenuation material layers dependently in the gallbladder, compatible with biliary sludge. Gallbladder wall appears mildly thickened measuring 6 mm. No definite pericholecystic fluid or stranding.  Pancreas: No pancreatic mass. Slight haziness in the peripancreatic fat. No peripancreatic fluid collections identified on today's noncontrast CT examination.  Spleen: Unremarkable.  Adrenals/Urinary Tract: Numerous large nonobstructive calculi are present within the left renal collecting system, measuring up to 9 mm in the lower pole. No additional calculi are identified along the course of either ureter. However, in the left side of the urinary bladder at or immediately beyond the left ureterovesicular junction there is a 3 mm calculus (image 84 of series 2). Urinary bladder appears diffusely thickened. Bilateral adrenal glands are normal in appearance.  Stomach/Bowel: Postoperative changes in the stomach, suggestive of prior Roux-en-Y gastric  bypass. No pathologic dilatation of small bowel or colon.  Vascular/Lymphatic: Atherosclerosis throughout the abdominal and pelvic vasculature, without evidence of aneurysm. Retroaortic left renal vein (normal anatomical variant) incidentally noted. No lymphadenopathy identified in the abdomen or pelvis on today's noncontrast CT examination.  Reproductive: Prostate gland and seminal vesicles are normal in appearance.  Other: No significant volume of ascites.  No pneumoperitoneum.  Musculoskeletal: Old compression fracture of the superior endplate of L1 with approximately 30% loss of anterior vertebral body height is unchanged. There are no aggressive appearing lytic or blastic lesions noted in the visualized portions of the skeleton.  IMPRESSION: 1. 3 mm calculus in the urinary bladder at or immediately beyond the left ureterovesicular junction. There is no associated hydroureteronephrosis to indicate urinary tract obstruction at this time. Multiple additional large left-sided nonobstructive calculi, largest which measures 9 mm in the lower pole collecting system of the left kidney. 2. Slight haziness of the peripancreatic fat. Clinical correlation for signs and symptoms of acute pancreatitis is suggested. 3. Small amount of biliary sludge layering dependently in the gallbladder. Gallbladder wall appears thickened (6 mm). However, there is no pericholecystic fluid or surrounding inflammatory changes to strongly suggest an acute cholecystitis at this time. Clinical correlation is recommended. If there is concern for acute cholecystitis, further evaluation with right upper quadrant ultrasound would be recommended. 4. Mild diffuse thickening of the urinary bladder wall, poorly evaluated on today's noncontrast CT examination. 5. Additional incidental findings, as above.   Electronically Signed   By: Trudie Reed M.D.   On: 12/21/2014 15:14   Dg Chest 2 View  12/21/2014   CLINICAL DATA:  CHF.  EXAM: CHEST  2 VIEW   COMPARISON:  12/20/2014.  FINDINGS: The cardiac silhouette remains mildly enlarged. Clear lungs with normal vascularity. Unremarkable bones.  IMPRESSION: No acute abnormality.  Stable mild cardiomegaly.   Electronically Signed   By: Beckie Salts M.D.   On: 12/21/2014 15:08    Scheduled Meds:  Scheduled Meds: . antiseptic oral rinse  7 mL Mouth Rinse q12n4p  . chlorhexidine  15 mL Mouth Rinse BID  . doxazosin  2 mg Oral Q breakfast  . feeding supplement (ENSURE ENLIVE)  237 mL Oral BID BM  . heparin  5,000 Units Subcutaneous 3 times per day  . lisinopril  40 mg Oral Daily  . meropenem (MERREM) IV  1 g Intravenous 3 times per day  . metoprolol tartrate  25 mg Oral BID  . pantoprazole (PROTONIX) IV  40 mg Intravenous QHS  . pneumococcal 23 valent vaccine  0.5 mL Intramuscular Tomorrow-1000  . potassium chloride SA  20 mEq Oral BID WC   Continuous Infusions: . sodium chloride 125 mL/hr at 12/23/14 0931    Time spent on care of this patient: 40 min   Ishi Danser, MD 12/23/2014, 1:30 PM  LOS: 3 days   [facility] Office  [phone] Pager - Text Page per [email or web address] If 7PM-7AM, please contact night-coverage [email or web address]

## 2014-12-23 NOTE — Progress Notes (Signed)
ANTICOAGULATION CONSULT NOTE - Initial Consult  Pharmacy Consult for Lovenox Indication: atrial fibrillation  No Known Allergies  Patient Measurements: Height:  (175.3 cm) Weight: 295 lb 6.7 oz (134 kg) IBW/kg (Calculated) : 70.7  Vital Signs: Temp: 98.1 F (36.7 C) (07/30 1311) Temp Source: Oral (07/30 1311) BP: 184/91 mmHg (07/30 1311) Pulse Rate: 78 (07/30 1311)  Labs:  Recent Labs  12/20/14 2032 12/21/14 1133 12/21/14 1225 12/21/14 1810 12/21/14 2350 12/22/14 0240 12/23/14 0510  HGB 13.9  --   --   --   --  11.6* 12.0*  HCT 40.6  --   --   --   --  34.9* 37.4*  PLT 239  --   --   --   --  202 209  LABPROT  --   --  16.3*  --   --   --   --   INR  --   --  1.29  --   --   --   --   CREATININE 1.00  --   --   --   --  0.76 0.59*  TROPONINI  --  0.23*  --  0.10* <0.03  --   --     Estimated Creatinine Clearance: 130 mL/min (by C-G formula based on Cr of 0.59).   Medical History: Past Medical History  Diagnosis Date  . History of Roux-en-Y gastric bypass 2012  . Paroxysmal a-fib   . Depression   . Asthma   . Right foot drop   . BPH (benign prostatic hyperplasia)   . CHF (congestive heart failure)   . Hypertension   . COPD (chronic obstructive pulmonary disease)     Medications:  Scheduled:  . amitriptyline  25 mg Oral TID  . antiseptic oral rinse  7 mL Mouth Rinse q12n4p  . buPROPion  150 mg Oral Daily  . chlorhexidine  15 mL Mouth Rinse BID  . doxazosin  2 mg Oral Q breakfast  . escitalopram  10 mg Oral TID  . feeding supplement (ENSURE ENLIVE)  237 mL Oral BID BM  . heparin  5,000 Units Subcutaneous 3 times per day  . lisinopril  40 mg Oral Daily  . meropenem (MERREM) IV  1 g Intravenous 3 times per day  . metoprolol tartrate  50 mg Oral BID  . pantoprazole (PROTONIX) IV  40 mg Intravenous QHS  . pneumococcal 23 valent vaccine  0.5 mL Intramuscular Tomorrow-1000  . potassium chloride SA  20 mEq Oral BID WC   Infusions:  . sodium  chloride 125 mL/hr at 12/23/14 0931   PRN: acetaminophen, budesonide-formoterol, levalbuterol, methocarbamol (ROBAXIN)  IV, morphine injection, ondansetron (ZOFRAN) IV  Assessment: 64 yo male with recurrent biliary pancreatitis. Suspected to have biliary pancreatitis Plan for ERCP support next week to clear CBD at the time of surgery. Developed A-flutter with RVR in AM 7/30, then converted back to NSR. Pharmacy is consulted to dose Lovenox.  Weight: 134kg SCr: improved, 0.59 CrCl >100 ml/min Hgb: decreased slightly, 12 Plts: stable, 209k  Goal of Therapy:  Anti-Xa level 0.6-1 units/ml 4hrs after LMWH dose given Monitor platelets by anticoagulation protocol: Yes   Plan:   Lovenox  SQ q12h   Monitor renal function, CBC and signs/symptoms of bleeding  MD to instruct when to hold Lovenox prior to procedure next week  Loralee Pacas, PharmD, BCPS Pager: [phone] 12/23/2014,1:53 PM

## 2014-12-23 NOTE — Progress Notes (Signed)
Pt stated that he has been having diarrhea for the past year and a half.  MD made aware will continue to monitor closely.

## 2014-12-23 NOTE — Progress Notes (Signed)
Patient blood pressure 184/91, pulse 78.  MD made aware.  Will continue to monitor closely.

## 2014-12-24 DIAGNOSIS — I4892 Unspecified atrial flutter: Secondary | ICD-10-CM

## 2014-12-24 DIAGNOSIS — K859 Acute pancreatitis, unspecified: Secondary | ICD-10-CM

## 2014-12-24 DIAGNOSIS — I10 Essential (primary) hypertension: Secondary | ICD-10-CM

## 2014-12-24 LAB — CBC WITH DIFFERENTIAL/PLATELET
BASOS PCT: 1 % (ref 0–1)
Basophils Absolute: 0.1 10*3/uL (ref 0.0–0.1)
Eosinophils Absolute: 0.3 10*3/uL (ref 0.0–0.7)
Eosinophils Relative: 3 % (ref 0–5)
HEMATOCRIT: 37.3 % — AB (ref 39.0–52.0)
Hemoglobin: 11.9 g/dL — ABNORMAL LOW (ref 13.0–17.0)
LYMPHS PCT: 16 % (ref 12–46)
Lymphs Abs: 1.2 10*3/uL (ref 0.7–4.0)
MCH: 28.5 pg (ref 26.0–34.0)
MCHC: 31.9 g/dL (ref 30.0–36.0)
MCV: 89.4 fL (ref 78.0–100.0)
Monocytes Absolute: 0.8 10*3/uL (ref 0.1–1.0)
Monocytes Relative: 10 % (ref 3–12)
NEUTROS ABS: 5.4 10*3/uL (ref 1.7–7.7)
Neutrophils Relative %: 70 % (ref 43–77)
Platelets: 213 10*3/uL (ref 150–400)
RBC: 4.17 MIL/uL — ABNORMAL LOW (ref 4.22–5.81)
RDW: 15.9 % — AB (ref 11.5–15.5)
WBC: 7.7 10*3/uL (ref 4.0–10.5)

## 2014-12-24 LAB — COMPREHENSIVE METABOLIC PANEL
ALBUMIN: 2.8 g/dL — AB (ref 3.5–5.0)
ALT: 45 U/L (ref 17–63)
AST: 41 U/L (ref 15–41)
Alkaline Phosphatase: 501 U/L — ABNORMAL HIGH (ref 38–126)
Anion gap: 8 (ref 5–15)
BUN: 10 mg/dL (ref 6–20)
CALCIUM: 8.3 mg/dL — AB (ref 8.9–10.3)
CO2: 24 mmol/L (ref 22–32)
Chloride: 102 mmol/L (ref 101–111)
Creatinine, Ser: 0.64 mg/dL (ref 0.61–1.24)
GFR calc Af Amer: >60 mL/min (ref >60)
GFR calc non Af Amer: >60 mL/min (ref >60)
GLUCOSE: 97 mg/dL (ref 65–99)
Potassium: 3.8 mmol/L (ref 3.5–5.1)
SODIUM: 134 mmol/L — AB (ref 135–145)
TOTAL PROTEIN: 7 g/dL (ref 6.5–8.1)
Total Bilirubin: 1.9 mg/dL — ABNORMAL HIGH (ref 0.3–1.2)

## 2014-12-24 LAB — LIPASE, BLOOD: Lipase: 72 U/L — ABNORMAL HIGH (ref 22–51)

## 2014-12-24 LAB — MAGNESIUM: Magnesium: 2 mg/dL (ref 1.7–2.4)

## 2014-12-24 MED ORDER — TIZANIDINE HCL 4 MG PO TABS: 4.0000 mg | ORAL_TABLET | Freq: Four times a day (QID) | ORAL | Status: DC | PRN

## 2014-12-24 MED ORDER — METHOCARBAMOL 500 MG PO TABS
1000.0000 mg | ORAL_TABLET | Freq: Four times a day (QID) | ORAL | Status: DC
  Administered 2014-12-24 – 2014-12-29 (×20): 1000 mg via ORAL
  Filled 2014-12-24 (×25): qty 2

## 2014-12-24 MED ORDER — AMLODIPINE BESYLATE 10 MG PO TABS
10.0000 mg | ORAL_TABLET | Freq: Every day | ORAL | Status: DC
  Administered 2014-12-24 – 2014-12-29 (×6): 10 mg via ORAL
  Filled 2014-12-24 (×6): qty 1

## 2014-12-24 MED ORDER — HYDROCODONE-ACETAMINOPHEN 10-325 MG PO TABS
0.5000 | ORAL_TABLET | Freq: Four times a day (QID) | ORAL | Status: DC | PRN
  Administered 2014-12-24 – 2014-12-25 (×5): 2 via ORAL
  Administered 2014-12-26 – 2014-12-27 (×6): 1 via ORAL
  Administered 2014-12-27 – 2014-12-29 (×8): 2 via ORAL
  Filled 2014-12-24: qty 1
  Filled 2014-12-24 (×8): qty 2
  Filled 2014-12-24: qty 1
  Filled 2014-12-24 (×5): qty 2
  Filled 2014-12-24: qty 1
  Filled 2014-12-24: qty 2
  Filled 2014-12-24 (×2): qty 1

## 2014-12-24 NOTE — Progress Notes (Signed)
Pharmacy - Lovenox dosing  Assessment: 63 yoM started on Lovenox for new A-flutter with RVR on 7/30.  Planning ERCP early this week for recurrent biliary pancreatitis.  Currently in NSR  Plan:  Spoke with MD, will hold tonight's Lovenox dose as GI may be able to take patient for ERCP as early as tomorrow.    Per MD, have placed SCDs for VTE prophylaxis while off anticoagulation.  Will f/u with [name] and [facility] regarding if/when to resume Lovenox post-op.  Bernadene Person, PharmD, BCPS Pager: [phone] 12/24/2014, 3:17 PM

## 2014-12-24 NOTE — Progress Notes (Signed)
Patient ID: Brett Cline, male   DOB: 01-02-1951, 64 y.o.   MRN: [id number] [facility] Surgery Progress Note:   * No surgery found *  Subjective: Mental status is clear.   Objective: Vital signs in last 24 hours: Temp:  [97.8 F (36.6 C)-98.3 F (36.8 C)] 97.8 F (36.6 C) (07/31 0551) Pulse Rate:  [69-79] 69 (07/31 0551) Resp:  [16-18] 16 (07/31 0551) BP: (166-188)/(83-98) 188/98 mmHg (07/31 0551) SpO2:  [99 %-100 %] 100 % (07/31 0551)  Intake/Output from previous day: 07/30 0701 - 07/31 0700 In: 1455 [P.O.:1200; IV Piggyback:255] Out: 2774 [Urine:4875] Intake/Output this shift:    Physical Exam: Work of breathing is normal.  Feeling much better.    Lab Results:  Results for orders placed or performed during the hospital encounter of 12/20/14 (from the past 48 hour(s))  Comprehensive metabolic panel     Status: Abnormal   Collection Time: 12/23/14  5:10 AM  Result Value Ref Range   Sodium 135 135 - 145 mmol/L   Potassium 3.6 3.5 - 5.1 mmol/L   Chloride 102 101 - 111 mmol/L   CO2 24 22 - 32 mmol/L   Glucose, Bld 79 65 - 99 mg/dL   BUN 7 6 - 20 mg/dL   Creatinine, Ser 0.59 (L) 0.61 - 1.24 mg/dL   Calcium 8.5 (L) 8.9 - 10.3 mg/dL   Total Protein 7.1 6.5 - 8.1 g/dL   Albumin 2.8 (L) 3.5 - 5.0 g/dL   AST 49 (H) 15 - 41 U/L   ALT 53 17 - 63 U/L   Alkaline Phosphatase 503 (H) 38 - 126 U/L   Total Bilirubin 2.6 (H) 0.3 - 1.2 mg/dL   GFR calc non Af Amer >60 >60 mL/min   GFR calc Af Amer >60 >60 mL/min    Comment: (NOTE) The eGFR has been calculated using the CKD EPI equation. This calculation has not been validated in all clinical situations. eGFR's persistently <60 mL/min signify possible Chronic Kidney Disease.    Anion gap 9 5 - 15  CBC with Differential/Platelet     Status: Abnormal   Collection Time: 12/23/14  5:10 AM  Result Value Ref Range   WBC 8.6 4.0 - 10.5 K/uL   RBC 4.21 (L) 4.22 - 5.81 MIL/uL   Hemoglobin 12.0 (L) 13.0 - 17.0 g/dL   HCT 37.4 (L)  39.0 - 52.0 %   MCV 88.8 78.0 - 100.0 fL   MCH 28.5 26.0 - 34.0 pg   MCHC 32.1 30.0 - 36.0 g/dL   RDW 15.9 (H) 11.5 - 15.5 %   Platelets 209 150 - 400 K/uL   Neutrophils Relative % 73 43 - 77 %   Neutro Abs 6.3 1.7 - 7.7 K/uL   Lymphocytes Relative 15 12 - 46 %   Lymphs Abs 1.3 0.7 - 4.0 K/uL   Monocytes Relative 9 3 - 12 %   Monocytes Absolute 0.7 0.1 - 1.0 K/uL   Eosinophils Relative 2 0 - 5 %   Eosinophils Absolute 0.2 0.0 - 0.7 K/uL   Basophils Relative 1 0 - 1 %   Basophils Absolute 0.1 0.0 - 0.1 K/uL  Magnesium     Status: None   Collection Time: 12/23/14  5:10 AM  Result Value Ref Range   Magnesium 1.8 1.7 - 2.4 mg/dL  Lipase, blood     Status: Abnormal   Collection Time: 12/23/14  5:10 AM  Result Value Ref Range   Lipase 82 (H) 22 -  51 U/L  Comprehensive metabolic panel     Status: Abnormal   Collection Time: 12/24/14  5:19 AM  Result Value Ref Range   Sodium 134 (L) 135 - 145 mmol/L   Potassium 3.8 3.5 - 5.1 mmol/L   Chloride 102 101 - 111 mmol/L   CO2 24 22 - 32 mmol/L   Glucose, Bld 97 65 - 99 mg/dL   BUN 10 6 - 20 mg/dL   Creatinine, Ser 0.64 0.61 - 1.24 mg/dL   Calcium 8.3 (L) 8.9 - 10.3 mg/dL   Total Protein 7.0 6.5 - 8.1 g/dL   Albumin 2.8 (L) 3.5 - 5.0 g/dL   AST 41 15 - 41 U/L   ALT 45 17 - 63 U/L   Alkaline Phosphatase 501 (H) 38 - 126 U/L   Total Bilirubin 1.9 (H) 0.3 - 1.2 mg/dL   GFR calc non Af Amer >60 >60 mL/min   GFR calc Af Amer >60 >60 mL/min    Comment: (NOTE) The eGFR has been calculated using the CKD EPI equation. This calculation has not been validated in all clinical situations. eGFR's persistently <60 mL/min signify possible Chronic Kidney Disease.    Anion gap 8 5 - 15  CBC with Differential/Platelet     Status: Abnormal   Collection Time: 12/24/14  5:19 AM  Result Value Ref Range   WBC 7.7 4.0 - 10.5 K/uL   RBC 4.17 (L) 4.22 - 5.81 MIL/uL   Hemoglobin 11.9 (L) 13.0 - 17.0 g/dL   HCT 37.3 (L) 39.0 - 52.0 %   MCV 89.4 78.0 -  100.0 fL   MCH 28.5 26.0 - 34.0 pg   MCHC 31.9 30.0 - 36.0 g/dL   RDW 15.9 (H) 11.5 - 15.5 %   Platelets 213 150 - 400 K/uL   Neutrophils Relative % 70 43 - 77 %   Neutro Abs 5.4 1.7 - 7.7 K/uL   Lymphocytes Relative 16 12 - 46 %   Lymphs Abs 1.2 0.7 - 4.0 K/uL   Monocytes Relative 10 3 - 12 %   Monocytes Absolute 0.8 0.1 - 1.0 K/uL   Eosinophils Relative 3 0 - 5 %   Eosinophils Absolute 0.3 0.0 - 0.7 K/uL   Basophils Relative 1 0 - 1 %   Basophils Absolute 0.1 0.0 - 0.1 K/uL  Magnesium     Status: None   Collection Time: 12/24/14  5:19 AM  Result Value Ref Range   Magnesium 2.0 1.7 - 2.4 mg/dL  Lipase, blood     Status: Abnormal   Collection Time: 12/24/14  5:19 AM  Result Value Ref Range   Lipase 72 (H) 22 - 51 U/L    Radiology/Results: No results found.  Anti-infectives: Anti-infectives    Start     Dose/Rate Route Frequency Ordered Stop   12/20/14 2200  meropenem (MERREM) 1 g in sodium chloride 0.9 % 100 mL IVPB  Status:  Discontinued     1 g 200 mL/hr over 30 Minutes Intravenous Every 12 hours 12/20/14 2122 12/20/14 2155   12/20/14 2200  ciprofloxacin (CIPRO) IVPB 400 mg  Status:  Discontinued     400 mg 200 mL/hr over 60 Minutes Intravenous Every 12 hours 12/20/14 2122 12/22/14 1327   12/20/14 2200  meropenem (MERREM) 1 g in sodium chloride 0.9 % 100 mL IVPB     1 g 200 mL/hr over 30 Minutes Intravenous 3 times per day 12/20/14 2155        Assessment/Plan: Problem  List: Patient Active Problem List   Diagnosis Date Noted  . Atrial flutter 12/24/2014  . Malnutrition of moderate degree 12/22/2014  . Essential hypertension   . Cholelithiasis without obstruction   . Acute gallstone pancreatitis 12/20/2014  . Choledocholithiasis 11/22/2014  . Pancreatitis, acute 11/22/2014  . Morbid obesity 11/22/2014  . Depression 11/22/2014  . Hypokalemia 11/22/2014  . Chronic back pain 11/22/2014  . Right foot drop 11/22/2014  . Asthma, chronic 11/22/2014    Trying to  arrange ERCP with lap chole (per gastrotomy).  Hopeful surgery next week.   * No surgery found *    LOS: 4 days   Matt B. Hassell Done, MD, [facility] [phone] beeper [phone]  12/24/2014 11:51 AM

## 2014-12-24 NOTE — Evaluation (Signed)
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 Pt currently with functional limitations due to the deficits listed below (see PT Problem List).  Pt will benefit from skilled PT to increase their independence and safety with mobility to allow discharge to the venue listed below.   Pt very pleasant and willing to work with PT, not recommending f/u as long as pt continues to progress, he has an exercise program he has been doing at home with theraband and feel he should continue ( or work back up to it)  at home after D/C     Follow Up Recommendations No PT follow up    Equipment Recommendations  None recommended by PT    Recommendations for Other Services       Precautions / Restrictions Precautions Precautions: Fall Restrictions Weight Bearing Restrictions: No      Mobility  Bed Mobility Overal bed mobility: Needs Assistance Bed Mobility: Supine to Sit     Supine to sit: Min guard;HOB elevated     General bed mobility comments: uses rail, light assist with trunk and incr time  Transfers Overall transfer level: Needs assistance Equipment used: Rolling walker (2 wheeled) Transfers: Sit to/from Stand Sit to Stand: Min guard         General transfer comment: cues for hand placement and safety  Ambulation/Gait Ambulation/Gait assistance: Min guard;Min assist Ambulation Distance (Feet): 100 Feet Assistive device: Rolling walker (2 wheeled) Gait Pattern/deviations: Step-through pattern;Trunk flexed;Drifts right/left;Decreased stride  length     General Gait Details: maintains RLE in external rotation, toe drag/drop foot on R; cues for safety especially with turns, cues for proper and safe RW distance from self, posture; pt does amb with incr trunk flexion at baseline  Stairs            Wheelchair Mobility    Modified Rankin (Stroke Patients Only)       Balance Overall balance assessment: Needs assistance           Standing balance-Leahy Scale: Fair                               Pertinent Vitals/Pain Pain Assessment: 0-10 Pain Score: 4  Pain Location: back - chronic Pain Descriptors / Indicators: Sore Pain Intervention(s): Limited activity within patient's tolerance;Monitored during session;Repositioned    Home Living Family/patient expects to be discharged to:: Private residence Living Arrangements: Children (son and dtr)   Type of Home: House Home Access: Stairs to enter   [employer] of Steps: 4" step from driveway to porch Home Layout: One level Home Equipment: Environmental consultant - 2 wheels;Wheelchair - manual;Hospital bed      Prior Function Level of Independence: Independent with assistive device(s);Needs assistance   Gait / Transfers Assistance Needed: amb with RW  ADL's / Homemaking Assistance Needed: assist with RLE socks and shoes d/t drop foot per pt        Hand Dominance        Extremity/Trunk Assessment   Upper Extremity Assessment: Overall WFL for tasks assessed  Lower Extremity Assessment: Overall WFL for tasks assessed (mildly edematous)         Communication      Cognition Arousal/Alertness: Awake/alert Behavior During Therapy: WFL for tasks assessed/performed;Flat affect Overall Cognitive Status: Within Functional Limits for tasks assessed                      General Comments      Exercises        Assessment/Plan    PT Assessment Patient needs continued PT services  PT Diagnosis Difficulty walking   PT  Problem List Decreased strength;Decreased range of motion;Decreased activity tolerance;Decreased balance;Decreased mobility;Obesity  PT Treatment Interventions DME instruction;Gait training;Functional mobility training;Therapeutic activities;Therapeutic exercise   PT Goals (Current goals can be found in the Care Plan section) Acute Rehab PT Goals Patient Stated Goal: to get back to his exercises at home, return to independent PT Goal Formulation: With patient Time For Goal Achievement: 12/31/14 Potential to Achieve Goals: Good    Frequency     Barriers to discharge        Co-evaluation               End of Session Equipment Utilized During Treatment: Gait belt Activity Tolerance: Patient tolerated treatment well Patient left: in chair;with call bell/phone within reach Nurse Communication: Mobility status         Time: 1610-9604 PT Time Calculation (min) (ACUTE ONLY): 18 min   Charges:   PT Evaluation $Initial PT Evaluation Tier I: 1 Procedure     PT G CodesDrucilla Chalet January 12, 2015, 4:29 PM

## 2014-12-24 NOTE — Consult Note (Signed)
[facility] Consult Note   Brett Cline:[id number] DOB: 22-Jun-1950 DOA: 12/20/2014 PCP: Noni Saupe., MD  Brief narrative: Brett Cline is a 64 y.o. 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 At the end of June, was transferred from [place] to [facility] for gallstone pancreatitis then discharged a few days later for follow-up with general surgery. General surgery was to call him for an appt.  Subjective: Eating well without abdominal pain. Has back pain in lumbar area. No nausea or vomiting.   Assessment/Plan: Principal Problem:   Acute gallstone pancreatitis - recurrent cholelithiasis/choledocholithiasis/Sepsis  - pancreatitis resolved- advanced to low fat diet- stop IVF - the patient was sent from [facility] to [place] long yesterday for bariatric surgeons to perform cholecystectomy - surgery to perform cholecystectomy with concurrent ERCP by GI - Dr Elnoria Howard to return on Monday  Active Problems: - a-flutter RVR- new issue -he has no history of this in the past - had RVR this AM-HR improved after IV Lopressor- converted back to sinus rhythm later in the morning -  increased Lopressor to 50 BID from 25 BID- no recurrent episodes yet - ECHO mentioned below-  started Lovenox for stroke prophylaxis for now which can be held prior to surgery  Bacteremia Escherichia coli - cultures done at [facility] -Organism pansensitive-  on Meropenam  HTN - Metoprolol 50 bid, Lisinopril 40 daily, add Norvasc 10 today - also on Doxazosin for BPH   BPH - cont doxazosin   Morbid obesity -Status post post-gastric bypass-   Depression -resumed Elavil, Lexapro, Wellbutrin   Chronic back pain -cont Robaxin-  Norco on hold-being given Morphine IV- have d/c'd this- resumed Norco- do not want to increase his narcotics for his chronic back pain... Add k pad, mobilize out of bed   Right foot drop -PT eval, OOB   Asthma, chronic -Stable-continue Symbicort and PRN Xopenex     Code Status: full code Family Communication:  Disposition Plan: per surgery DVT prophylaxis: full dose lovenox Consultants:GI, gen surgery Procedures: ECHO: Impressions:  - Technically difficult; definity used; normal LV function; mild LVH; grade 1 diastolic dysfunction; moderate LAE.  Antibiotics: Anti-infectives    Start     Dose/Rate Route Frequency Ordered Stop   12/20/14 2200  meropenem (MERREM) 1 g in sodium chloride 0.9 % 100 mL IVPB  Status:  Discontinued     1 g 200 mL/hr over 30 Minutes Intravenous Every 12 hours 12/20/14 2122 12/20/14 2155   12/20/14 2200  ciprofloxacin (CIPRO) IVPB 400 mg  Status:  Discontinued     400 mg 200 mL/hr over 60 Minutes Intravenous Every 12 hours 12/20/14 2122 12/22/14 1327   12/20/14 2200  meropenem (MERREM) 1 g in sodium chloride 0.9 % 100 mL IVPB     1 g 200 mL/hr over 30 Minutes Intravenous 3 times per day 12/20/14 2155        Objective: Filed Weights   12/20/14 1710  Weight: 134 kg (295 lb 6.7 oz)    Intake/Output Summary (Last 24 hours) at 12/24/14 1020 Last data filed at 12/24/14 0343  Gross per 24 hour  Intake   1455 ml  Output   3875 ml  Net  -2420 ml     Vitals Filed Vitals:  12/23/14 0931 12/23/14 1311 12/23/14 2033 12/24/14 0551  BP:  184/91 166/83 188/98  Pulse: 100 78 79 69  Temp:  98.1 F (36.7 C) 98.3 F (36.8 C) 97.8 F (36.6 C)  TempSrc:  Oral Oral Oral  Resp:  18 16 16   Height:      Weight:      SpO2:  99% 99% 100%    Exam:  General:  Pt is alert, not in acute distress  HEENT: No icterus, No thrush, oral mucosa moist  Cardiovascular: regular rate and rhythm, S1/S2 No murmur  Respiratory: clear to auscultation  bilaterally   Abdomen: Soft, +Bowel sounds, non tender, non distended, no guarding  MSK: No LE edema, cyanosis or clubbing  Data Reviewed: Basic Metabolic Panel:  Recent Labs Lab 12/20/14 2032 12/22/14 0240 12/23/14 0510 12/24/14 0519  NA 135 134* 135 134*  K 4.0 3.9 3.6 3.8  CL 102 105 102 102  CO2 19* 22 24 24   GLUCOSE 123* 84 79 97  BUN 17 13 7 10   CREATININE 1.00 0.76 0.59* 0.64  CALCIUM 8.7* 8.5* 8.5* 8.3*  MG 1.7 1.9 1.8 2.0  PHOS 4.8*  --   --   --    Liver Function Tests:  Recent Labs Lab 12/20/14 2032 12/22/14 0240 12/23/14 0510 12/24/14 0519  AST 137* 74* 49* 41  ALT 97* 72* 53 45  ALKPHOS 589* 499* 503* 501*  BILITOT 9.0* 3.3* 2.6* 1.9*  PROT 7.0 6.3* 7.1 7.0  ALBUMIN 2.7* 2.3* 2.8* 2.8*    Recent Labs Lab 12/20/14 2032 12/22/14 0240 12/23/14 0510 12/24/14 0519  LIPASE 696* 120* 82* 72*   No results for input(s): AMMONIA in the last 168 hours. CBC:  Recent Labs Lab 12/20/14 2032 12/22/14 0240 12/23/14 0510 12/24/14 0519  WBC 49.3* 16.7* 8.6 7.7  NEUTROABS 46.8* 14.7* 6.3 5.4  HGB 13.9 11.6* 12.0* 11.9*  HCT 40.6 34.9* 37.4* 37.3*  MCV 87.9 89.9 88.8 89.4  PLT 239 202 209 213   Cardiac Enzymes:  Recent Labs Lab 12/21/14 1133 12/21/14 1810 12/21/14 2350  TROPONINI 0.23* 0.10* <0.03   BNP (last 3 results)  Recent Labs  12/21/14 1133  BNP 308.8*    ProBNP (last 3 results) No results for input(s): PROBNP in the last 8760 hours.  CBG: No results for input(s): GLUCAP in the last 168 hours.  Recent Results (from the past 240 hour(s))  MRSA PCR Screening     Status: None   Collection Time: 12/20/14  5:10 PM  Result Value Ref Range Status   MRSA by PCR NEGATIVE NEGATIVE Final    Comment:        The GeneXpert MRSA Assay (FDA approved for NASAL specimens only), is one component of a comprehensive MRSA colonization surveillance program. It is not intended to diagnose MRSA infection nor to guide or monitor treatment  for MRSA infections.   Culture, blood (routine x 2)     Status: None (Preliminary result)   Collection Time: 12/21/14 11:33 AM  Result Value Ref Range Status   Specimen Description BLOOD RIGHT HAND  Final   Special Requests BOTTLES DRAWN AEROBIC ONLY 6CC  Final   Culture NO GROWTH 2 DAYS  Final   Report Status PENDING  Incomplete  Culture, blood (routine x 2)     Status: None (Preliminary result)   Collection Time: 12/21/14 11:45 AM  Result Value Ref Range Status   Specimen Description BLOOD RIGHT HAND  Final   Special Requests BOTTLES DRAWN  AEROBIC ONLY 10CC  Final   Culture NO GROWTH 2 DAYS  Final   Report Status PENDING  Incomplete  Culture, Urine     Status: None   Collection Time: 12/21/14 11:52 AM  Result Value Ref Range Status   Specimen Description URINE, CLEAN CATCH  Final   Special Requests NONE  Final   Culture NO GROWTH 1 DAY  Final   Report Status 12/22/2014 FINAL  Final     Studies: No results found.  Scheduled Meds:  Scheduled Meds: . amitriptyline  25 mg Oral TID  . amLODipine  10 mg Oral Daily  . antiseptic oral rinse  7 mL Mouth Rinse q12n4p  . buPROPion  150 mg Oral Daily  . chlorhexidine  15 mL Mouth Rinse BID  . doxazosin  2 mg Oral Q breakfast  . enoxaparin (LOVENOX) injection  1 mg/kg Subcutaneous Q12H  . escitalopram  10 mg Oral TID  . feeding supplement (ENSURE ENLIVE)  237 mL Oral BID BM  . lisinopril  40 mg Oral Daily  . meropenem (MERREM) IV  1 g Intravenous 3 times per day  . methocarbamol  1,000 mg Oral QID  . metoprolol tartrate  50 mg Oral BID  . pantoprazole (PROTONIX) IV  40 mg Intravenous QHS  . pneumococcal 23 valent vaccine  0.5 mL Intramuscular Tomorrow-1000  . potassium chloride SA  20 mEq Oral BID WC   Continuous Infusions:    Time spent on care of this patient: 40 min   Daton Szilagyi, MD 12/24/2014, 10:20 AM  LOS: 4 days   [facility] Office  [phone] Pager - Text Page per [email or web address] If 7PM-7AM, please  contact night-coverage [email or web address]

## 2014-12-25 DIAGNOSIS — G8929 Other chronic pain: Secondary | ICD-10-CM

## 2014-12-25 DIAGNOSIS — R100 Acute abdomen: Secondary | ICD-10-CM

## 2014-12-25 DIAGNOSIS — M549 Dorsalgia, unspecified: Secondary | ICD-10-CM

## 2014-12-25 LAB — CBC WITH DIFFERENTIAL/PLATELET
Basophils Absolute: 0.1 10*3/uL (ref 0.0–0.1)
Basophils Relative: 1 % (ref 0–1)
EOS PCT: 5 % (ref 0–5)
Eosinophils Absolute: 0.4 10*3/uL (ref 0.0–0.7)
HCT: 39 % (ref 39.0–52.0)
Hemoglobin: 12.8 g/dL — ABNORMAL LOW (ref 13.0–17.0)
Lymphocytes Relative: 25 % (ref 12–46)
Lymphs Abs: 2.1 10*3/uL (ref 0.7–4.0)
MCH: 29.7 pg (ref 26.0–34.0)
MCHC: 32.8 g/dL (ref 30.0–36.0)
MCV: 90.5 fL (ref 78.0–100.0)
MONO ABS: 0.8 10*3/uL (ref 0.1–1.0)
Monocytes Relative: 10 % (ref 3–12)
NEUTROS ABS: 4.9 10*3/uL (ref 1.7–7.7)
Neutrophils Relative %: 59 % (ref 43–77)
Platelets: 249 10*3/uL (ref 150–400)
RBC: 4.31 MIL/uL (ref 4.22–5.81)
RDW: 15.9 % — AB (ref 11.5–15.5)
WBC: 8.3 10*3/uL (ref 4.0–10.5)

## 2014-12-25 LAB — COMPREHENSIVE METABOLIC PANEL
ALBUMIN: 2.9 g/dL — AB (ref 3.5–5.0)
ALT: 48 U/L (ref 17–63)
AST: 49 U/L — AB (ref 15–41)
Alkaline Phosphatase: 521 U/L — ABNORMAL HIGH (ref 38–126)
Anion gap: 9 (ref 5–15)
BUN: 13 mg/dL (ref 6–20)
CO2: 23 mmol/L (ref 22–32)
Calcium: 8.7 mg/dL — ABNORMAL LOW (ref 8.9–10.3)
Chloride: 103 mmol/L (ref 101–111)
Creatinine, Ser: 0.69 mg/dL (ref 0.61–1.24)
GFR calc Af Amer: >60 mL/min (ref >60)
GFR calc non Af Amer: >60 mL/min (ref >60)
Glucose, Bld: 89 mg/dL (ref 65–99)
Potassium: 4.2 mmol/L (ref 3.5–5.1)
SODIUM: 135 mmol/L (ref 135–145)
Total Bilirubin: 1.7 mg/dL — ABNORMAL HIGH (ref 0.3–1.2)
Total Protein: 7.3 g/dL (ref 6.5–8.1)

## 2014-12-25 LAB — LIPASE, BLOOD: LIPASE: 54 U/L — AB (ref 22–51)

## 2014-12-25 LAB — MAGNESIUM: MAGNESIUM: 2 mg/dL (ref 1.7–2.4)

## 2014-12-25 MED ORDER — SODIUM CHLORIDE 0.9 % IV SOLN
INTRAVENOUS | Status: DC
  Administered 2014-12-25: 08:00:00 via INTRAVENOUS

## 2014-12-25 MED ORDER — LACTATED RINGERS IV SOLN
INTRAVENOUS | Status: DC
  Administered 2014-12-25 – 2014-12-27 (×3): via INTRAVENOUS

## 2014-12-25 MED ORDER — ENOXAPARIN SODIUM 150 MG/ML ~~LOC~~ SOLN
135.0000 mg | Freq: Once | SUBCUTANEOUS | Status: AC
  Administered 2014-12-25: 135 mg via SUBCUTANEOUS
  Filled 2014-12-25: qty 1

## 2014-12-25 NOTE — Consult Note (Signed)
[facility] Consult Note   [name]:[id number] DOB: 1950/10/12 DOA: 12/20/2014 PCP: Noni Saupe., MD- no longer sees him as he was unable to pay some fees- has an appt with the [facility] coming up  Brief narrative: Brett Cline is a 64 y.o. 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 At the end of June, was transferred from [place] to [facility] for gallstone pancreatitis then discharged a few days later for follow-up with general surgery. General surgery was to call him for an appt.  Subjective: Eating well without abdominal pain. Has back pain in lumbar area. No nausea or vomiting.   Assessment/Plan: Principal Problem:   Acute gallstone pancreatitis - recurrent cholelithiasis/choledocholithiasis/Sepsis  - pancreatitis resolved- advanced to low fat diet- stop IVF - the patient was sent from [facility] to [place] long yesterday for bariatric surgeons to perform cholecystectomy - surgery to perform cholecystectomy with concurrent ERCP by GI - Dr Elnoria Howard to return on Monday  Active Problems: - a-flutter RVR- new issue -he has no history of this in the past - had RVR this AM-HR improved after IV Lopressor- converted back to sinus rhythm later in the morning -  increased Lopressor to 50 BID from 25 BID- no recurrent episodes yet - ECHO mentioned below-  started Lovenox for stroke prophylaxis for now which can be held prior to surgery  Bacteremia Escherichia coli - cultures done at [facility] -Culture report with Sensitivities in paper chart-  on Meropenam started on 7/27-will need to give antibiotics to complete a 2 week or course-can transition to orals when closer to discharge  HTN - Metoprolol 50  bid, Lisinopril 40 daily, added Norvasc 10 on 7/31 - also on Doxazosin for BPH   BPH - cont doxazosin   Morbid obesity -Status post post-gastric bypass-   Depression -resumed Elavil, Lexapro, Wellbutrin   Chronic back pain -cont Robaxin- Norco on was hold-being given Morphine IV- have d/c'd this- resumed Norco- do not want to increase his narcotics for his chronic back pain... Added k pad, mobilize out of bed- he states his pain is better today   Right foot drop -PT eval, OOB   Asthma, chronic -Stable-continue Symbicort and PRN Xopenex     Code Status: full code Family Communication:  Disposition Plan: per surgery DVT prophylaxis: full dose lovenox Consultants:GI, gen surgery Procedures: ECHO: Impressions:  - Technically difficult; definity used; normal LV function; mild LVH; grade 1 diastolic dysfunction; moderate LAE.  Antibiotics: Anti-infectives    Start     Dose/Rate Route Frequency Ordered Stop   12/20/14 2200  meropenem (MERREM) 1 g in sodium chloride 0.9 % 100 mL IVPB  Status:  Discontinued     1 g 200 mL/hr over 30 Minutes Intravenous Every 12 hours 12/20/14 2122 12/20/14 2155   12/20/14 2200  ciprofloxacin (CIPRO) IVPB 400 mg  Status:  Discontinued     400 mg 200 mL/hr over 60 Minutes Intravenous Every 12 hours 12/20/14 2122 12/22/14 1327   12/20/14 2200  meropenem (MERREM) 1 g in sodium chloride 0.9 % 100 mL IVPB     1 g 200 mL/hr over 30 Minutes Intravenous 3 times per day 12/20/14 2155        Objective: Filed Weights   12/20/14  1710 12/25/14 0500  Weight: 134 kg (295 lb 6.7 oz) 137.7 kg (303 lb 9.2 oz)    Intake/Output Summary (Last 24 hours) at 12/25/14 0812 Last data filed at 12/25/14 0981  Gross per 24 hour  Intake   1020 ml  Output   1625 ml  Net   -605 ml     Vitals Filed Vitals:   12/24/14 0551 12/24/14 1500 12/24/14 2024 12/25/14 0500  BP: 188/98 150/72 149/87 157/83  Pulse: 69 72 65 69  Temp: 97.8 F (36.6 C) 98.1 F  (36.7 C) 98 F (36.7 C) 97.9 F (36.6 C)  TempSrc: Oral Oral Oral Oral  Resp: 16 16 16 16   Height:      Weight:    137.7 kg (303 lb 9.2 oz)  SpO2: 100% 100% 99% 98%    Exam:  General:  Pt is alert, not in acute distress  HEENT: No icterus, No thrush, oral mucosa moist  Cardiovascular: regular rate and rhythm, S1/S2 No murmur  Respiratory: clear to auscultation bilaterally   Abdomen: Soft, +Bowel sounds, non tender, non distended, no guarding  MSK: No LE edema, cyanosis or clubbing  Data Reviewed: Basic Metabolic Panel:  Recent Labs Lab 12/20/14 2032 12/22/14 0240 12/23/14 0510 12/24/14 0519 12/25/14 0427  NA 135 134* 135 134* 135  K 4.0 3.9 3.6 3.8 4.2  CL 102 105 102 102 103  CO2 19* 22 24 24 23   GLUCOSE 123* 84 79 97 89  BUN 17 13 7 10 13   CREATININE 1.00 0.76 0.59* 0.64 0.69  CALCIUM 8.7* 8.5* 8.5* 8.3* 8.7*  MG 1.7 1.9 1.8 2.0 2.0  PHOS 4.8*  --   --   --   --    Liver Function Tests:  Recent Labs Lab 12/20/14 2032 12/22/14 0240 12/23/14 0510 12/24/14 0519 12/25/14 0427  AST 137* 74* 49* 41 49*  ALT 97* 72* 53 45 48  ALKPHOS 589* 499* 503* 501* 521*  BILITOT 9.0* 3.3* 2.6* 1.9* 1.7*  PROT 7.0 6.3* 7.1 7.0 7.3  ALBUMIN 2.7* 2.3* 2.8* 2.8* 2.9*    Recent Labs Lab 12/20/14 2032 12/22/14 0240 12/23/14 0510 12/24/14 0519 12/25/14 0427  LIPASE 696* 120* 82* 72* 54*   No results for input(s): AMMONIA in the last 168 hours. CBC:  Recent Labs Lab 12/20/14 2032 12/22/14 0240 12/23/14 0510 12/24/14 0519 12/25/14 0427  WBC 49.3* 16.7* 8.6 7.7 8.3  NEUTROABS 46.8* 14.7* 6.3 5.4 4.9  HGB 13.9 11.6* 12.0* 11.9* 12.8*  HCT 40.6 34.9* 37.4* 37.3* 39.0  MCV 87.9 89.9 88.8 89.4 90.5  PLT 239 202 209 213 249   Cardiac Enzymes:  Recent Labs Lab 12/21/14 1133 12/21/14 1810 12/21/14 2350  TROPONINI 0.23* 0.10* <0.03   BNP (last 3 results)  Recent Labs  12/21/14 1133  BNP 308.8*    ProBNP (last 3 results) No results for input(s):  PROBNP in the last 8760 hours.  CBG: No results for input(s): GLUCAP in the last 168 hours.  Recent Results (from the past 240 hour(s))  MRSA PCR Screening     Status: None   Collection Time: 12/20/14  5:10 PM  Result Value Ref Range Status   MRSA by PCR NEGATIVE NEGATIVE Final    Comment:        The GeneXpert MRSA Assay (FDA approved for NASAL specimens only), is one component of a comprehensive MRSA colonization surveillance program. It is not intended to diagnose MRSA infection nor to guide or monitor treatment for MRSA  infections.   Culture, blood (routine x 2)     Status: None (Preliminary result)   Collection Time: 12/21/14 11:33 AM  Result Value Ref Range Status   Specimen Description BLOOD RIGHT HAND  Final   Special Requests BOTTLES DRAWN AEROBIC ONLY 6CC  Final   Culture NO GROWTH 3 DAYS  Final   Report Status PENDING  Incomplete  Culture, blood (routine x 2)     Status: None (Preliminary result)   Collection Time: 12/21/14 11:45 AM  Result Value Ref Range Status   Specimen Description BLOOD RIGHT HAND  Final   Special Requests BOTTLES DRAWN AEROBIC ONLY 10CC  Final   Culture NO GROWTH 3 DAYS  Final   Report Status PENDING  Incomplete  Culture, Urine     Status: None   Collection Time: 12/21/14 11:52 AM  Result Value Ref Range Status   Specimen Description URINE, CLEAN CATCH  Final   Special Requests NONE  Final   Culture NO GROWTH 1 DAY  Final   Report Status 12/22/2014 FINAL  Final     Studies: No results found.  Scheduled Meds:  Scheduled Meds: . amitriptyline  25 mg Oral TID  . amLODipine  10 mg Oral Daily  . antiseptic oral rinse  7 mL Mouth Rinse q12n4p  . buPROPion  150 mg Oral Daily  . chlorhexidine  15 mL Mouth Rinse BID  . doxazosin  2 mg Oral Q breakfast  . escitalopram  10 mg Oral TID  . feeding supplement (ENSURE ENLIVE)  237 mL Oral BID BM  . lisinopril  40 mg Oral Daily  . meropenem (MERREM) IV  1 g Intravenous 3 times per day  .  methocarbamol  1,000 mg Oral QID  . metoprolol tartrate  50 mg Oral BID  . pantoprazole (PROTONIX) IV  40 mg Intravenous QHS  . potassium chloride SA  20 mEq Oral BID WC   Continuous Infusions:    Time spent on care of this patient: 40 min   Brena Windsor, MD 12/25/2014, 8:12 AM  LOS: 5 days   [facility] Office  [phone] Pager - Text Page per [email or web address] If 7PM-7AM, please contact night-coverage [email or web address]

## 2014-12-25 NOTE — Progress Notes (Signed)
Subjective: Yesterday he felt the best.  Today he has some mild discomfort.  Objective: Vital signs in last 24 hours: Temp:  [97.9 F (36.6 C)-98.1 F (36.7 C)] 97.9 F (36.6 C) (08/01 0500) Pulse Rate:  [65-72] 69 (08/01 0500) Resp:  [16] 16 (08/01 0500) BP: (149-157)/(72-87) 157/83 mmHg (08/01 0500) SpO2:  [98 %-100 %] 98 % (08/01 0500) Weight:  [137.7 kg (303 lb 9.2 oz)] 137.7 kg (303 lb 9.2 oz) (08/01 0500) Last BM Date: 12/24/14  Intake/Output from previous day: 07/31 0701 - 08/01 0700 In: 1020 [P.O.:720; IV Piggyback:300] Out: 1625 [Urine:1625] Intake/Output this shift:    General appearance: alert and no distress GI: soft, non-tender; bowel sounds normal; no masses,  no organomegaly  Lab Results:  Recent Labs  12/23/14 0510 12/24/14 0519 12/25/14 0427  WBC 8.6 7.7 8.3  HGB 12.0* 11.9* 12.8*  HCT 37.4* 37.3* 39.0  PLT 209 213 249   BMET  Recent Labs  12/23/14 0510 12/24/14 0519 12/25/14 0427  NA 135 134* 135  K 3.6 3.8 4.2  CL 102 102 103  CO2 GLUCOSE 79 97 89  BUN CREATININE 0.59* 0.64 0.69  CALCIUM 8.5* 8.3* 8.7*   LFT  Recent Labs  12/25/14 0427  PROT 7.3  ALBUMIN 2.9*  AST 49*  ALT 48  ALKPHOS 521*  BILITOT 1.7*   PT/INR No results for input(s): LABPROT, INR in the last 72 hours. Hepatitis Panel No results for input(s): HEPBSAG, HCVAB, HEPAIGM, HEPBIGM in the last 72 hours. C-Diff No results for input(s): CDIFFTOX in the last 72 hours. Fecal Lactopherrin No results for input(s): FECLLACTOFRN in the last 72 hours.  Studies/Results: No results found.  Medications:  Scheduled: . amitriptyline  25 mg Oral TID  . amLODipine  10 mg Oral Daily  . antiseptic oral rinse  7 mL Mouth Rinse q12n4p  . buPROPion  150 mg Oral Daily  . chlorhexidine  15 mL Mouth Rinse BID  . doxazosin  2 mg Oral Q breakfast  . escitalopram  10 mg Oral TID  . feeding supplement (ENSURE ENLIVE)  237 mL Oral BID BM  . lisinopril  40 mg  Oral Daily  . meropenem (MERREM) IV  1 g Intravenous 3 times per day  . methocarbamol  1,000 mg Oral QID  . metoprolol tartrate  50 mg Oral BID  . pantoprazole (PROTONIX) IV  40 mg Intravenous QHS  . potassium chloride SA  20 mEq Oral BID WC   Continuous:   Assessment/Plan: 1) Recurrent gallstone pancreatitis. 2) Cholelithiasis.   I spoke to the patient and Dr. Daphine Deutscher.  The plan is to perform a lap assisted ERCP tomorrow.  I discussed the risk of inflaming the pancreas again with the procedure, which the patient acknowledges.  He desires to proceed with the procedure.  Plan: 1) Lap assisted ERCP tomorrow at 2 PM.   LOS: 5 days   Brett Cline D 12/25/2014, 8:13 AM

## 2014-12-25 NOTE — Care Management Important Message (Signed)
Important Message  Patient Details  Name: Brett Cline MRN: [id number] Date of Birth: Feb 04, 1951   Medicare Important Message Given:  Yes-second notification given    Renie Ora 12/25/2014, 2:50 PMImportant Message  Patient Details  Name: Brett Cline MRN: [id number] Date of Birth: 07-03-1950   Medicare Important Message Given:  Yes-second notification given    Renie Ora 12/25/2014, 2:50 PM

## 2014-12-25 NOTE — Progress Notes (Signed)
Subjective: He is feeling somewhat better.  Plan is for surgery tomorrow.  Dr. Andrey Campanile and Dr. Elnoria Howard have spoken with him.  Objective: Vital signs in last 24 hours: Temp:  [97.9 F (36.6 C)-98.1 F (36.7 C)] 97.9 F (36.6 C) (08/01 0500) Pulse Rate:  [65-72] 69 (08/01 0500) Resp:  [16] 16 (08/01 0500) BP: (149-157)/(72-87) 157/83 mmHg (08/01 0500) SpO2:  [98 %-100 %] 98 % (08/01 0500) Weight:  [137.7 kg (303 lb 9.2 oz)] 137.7 kg (303 lb 9.2 oz) (08/01 0500) Last BM Date: 12/25/14 720 PO yesterday recorded. Diet:  NPO Afebrile, VSS Labs OK Bilirubin is stable. Lipase down to 54 Wbc is normal.  Intake/Output from previous day: 07/31 0701 - 08/01 0700 In: 1020 [P.O.:720; IV Piggyback:300] Out: 1625 [Urine:1625] Intake/Output this shift:    General appearance: alert, cooperative and no distress GI: soft, abdomen is still sore.    Lab Results:   Recent Labs  12/24/14 0519 12/25/14 0427  WBC 7.7 8.3  HGB 11.9* 12.8*  HCT 37.3* 39.0  PLT 213 249    BMET  Recent Labs  12/24/14 0519 12/25/14 0427  NA 134* 135  K 3.8 4.2  CL 102 103  CO2 24 23  GLUCOSE 97 89  BUN 10 13  CREATININE 0.64 0.69  CALCIUM 8.3* 8.7*   PT/INR No results for input(s): LABPROT, INR in the last 72 hours.   Recent Labs Lab 12/20/14 2032 12/22/14 0240 12/23/14 0510 12/24/14 0519 12/25/14 0427  AST 137* 74* 49* 41 49*  ALT 97* 72* 53 45 48  ALKPHOS 589* 499* 503* 501* 521*  BILITOT 9.0* 3.3* 2.6* 1.9* 1.7*  PROT 7.0 6.3* 7.1 7.0 7.3  ALBUMIN 2.7* 2.3* 2.8* 2.8* 2.9*     Lipase     Component Value Date/Time   LIPASE 54* 12/25/2014 0427     Studies/Results: No results found.  Medications: . amitriptyline  25 mg Oral TID  . amLODipine  10 mg Oral Daily  . antiseptic oral rinse  7 mL Mouth Rinse q12n4p  . buPROPion  150 mg Oral Daily  . chlorhexidine  15 mL Mouth Rinse BID  . doxazosin  2 mg Oral Q breakfast  . escitalopram  10 mg Oral TID  . feeding supplement  (ENSURE ENLIVE)  237 mL Oral BID BM  . lisinopril  40 mg Oral Daily  . meropenem (MERREM) IV  1 g Intravenous 3 times per day  . methocarbamol  1,000 mg Oral QID  . metoprolol tartrate  50 mg Oral BID  . pantoprazole (PROTONIX) IV  40 mg Intravenous QHS  . potassium chloride SA  20 mEq Oral BID WC   . sodium chloride 20 mL/hr at 12/25/14 0827   Prior to Admission medications   Medication Sig Start Date End Date Taking? Authorizing Provider  amitriptyline (ELAVIL) 25 MG tablet Take 25 mg by mouth 3 (three) times daily.  10/25/14  Yes Historical Provider, MD  aspirin-acetaminophen-caffeine (EXCEDRIN MIGRAINE) [phone] MG per tablet Take 2 tablets by mouth every 6 (six) hours as needed for migraine.   Yes Historical Provider, MD  azelastine (ASTELIN) 0.1 % nasal spray Place 1 spray into both nostrils daily.    Yes Historical Provider, MD  buPROPion (WELLBUTRIN XL) 150 MG 24 hr tablet Take 150 mg by mouth daily.  09/22/14  Yes Historical Provider, MD  buPROPion (WELLBUTRIN XL) 300 MG 24 hr tablet Take 300 mg by mouth daily.  09/22/14  Yes Historical Provider, MD  dicyclomine (BENTYL)  20 MG tablet Take 20 mg by mouth 4 (four) times daily -  before meals and at bedtime.  09/22/14  Yes Historical Provider, MD  doxazosin (CARDURA) 2 MG tablet Take 2 mg by mouth daily with breakfast.  09/22/14  Yes Historical Provider, MD  escitalopram (LEXAPRO) 10 MG tablet Take 10 mg by mouth 3 (three) times daily.  10/25/14  Yes Historical Provider, MD  felodipine (PLENDIL) 5 MG 24 hr tablet Take 10 mg by mouth daily.   Yes Historical Provider, MD  fluticasone (FLONASE) 50 MCG/ACT nasal spray Place 1 spray into both nostrils daily as needed for allergies.  09/22/14  Yes Historical Provider, MD  furosemide (LASIX) 40 MG tablet Take 40 mg by mouth daily as needed for fluid.  11/25/14  Yes Historical Provider, MD  HYDROcodone-acetaminophen (NORCO) 10-325 MG per tablet Take 0.5-2 tablets by mouth every 6 (six) hours as needed  (pain).  09/26/14  Yes Historical Provider, MD  lisinopril (PRINIVIL,ZESTRIL) 40 MG tablet Take 40 mg by mouth daily.  09/22/14  Yes Historical Provider, MD  polyvinyl alcohol (LIQUIFILM TEARS) 1.4 % ophthalmic solution Place 1 drop into both eyes every 6 (six) hours as needed for dry eyes.   Yes Historical Provider, MD  potassium chloride SA (K-DUR,KLOR-CON) 20 MEQ tablet Take 20 mEq by mouth 2 (two) times daily with a meal.  10/25/14  Yes Historical Provider, MD  SYMBICORT 160-4.5 MCG/ACT inhaler Inhale 2 puffs into the lungs 2 (two) times daily as needed (wheezing).  10/25/14  Yes Historical Provider, MD  tiZANidine (ZANAFLEX) 4 MG tablet Take 4-8 mg by mouth every 6 (six) hours as needed for muscle spasms.  11/21/14  Yes Historical Provider, MD   . sodium chloride 20 mL/hr at 12/25/14 0827     Assessment/Plan Gallstone pancreatitis E coli on blood cultures at [facility] S/p Roux-en-y gastric bypass 2008 PAF no anticoagulant Urinary bladder calculus/multiple left nephrolithiasis Hx of CHF Hx of COPD Hx of Hypertension Hx of depression Antibiotics: day 6 Meropenem; cultures sensitive to Rocephin at [place].  In paper chart.Pharmacy recommending Rocephin 2 gm q 24 and Flagyl if we want further GI coverage. I am deferring this to Dr. Andrey Campanile. DVT: lovenox today and then hold till after surgery tomorrow.   Plan:  Surgery and ERCP intraoperatively tomorrow.  Place him on some IV fluids tonight.  NPO after MN.    LOS: 5 days    Sanja Elizardo 12/25/2014

## 2014-12-25 NOTE — Care Management Note (Signed)
Case Management Note  Patient Details  Name: CASPER PAGLIUCA MRN: [id number] Date of Birth: 1950-08-28  Subjective/Objective: Acute pancreatitis. Readmit-6/29-7/2-Pancreatitis.From home.                   Action/Plan:d/c plan home.No anticipated d/c needs.   Expected Discharge Date:                Expected Discharge Plan:  Home/Self Care  In-House Referral:     Discharge planning Services  CM Consult  Post Acute Care Choice:    Choice offered to:     DME Arranged:    DME Agency:     HH Arranged:    HH Agency:     Status of Service:  In process, will continue to follow  Medicare Important Message Given:  Yes-second notification given Date Medicare IM Given:    Medicare IM give by:    Date Additional Medicare IM Given:    Additional Medicare Important Message give by:     If discussed at Long Length of Stay Meetings, dates discussed:    Additional Comments:  Lanier Clam, RN 12/25/2014, 3:23 PM

## 2014-12-26 ENCOUNTER — Inpatient Hospital Stay (HOSPITAL_COMMUNITY): Payer: Medicare HMO | Admitting: Anesthesiology

## 2014-12-26 ENCOUNTER — Encounter (HOSPITAL_COMMUNITY)
Admission: AD | Disposition: A | Discharge status: Home Health | Payer: Self-pay | Source: Other Acute Inpatient Hospital

## 2014-12-26 ENCOUNTER — Inpatient Hospital Stay (HOSPITAL_COMMUNITY): Payer: Medicare HMO

## 2014-12-26 ENCOUNTER — Encounter (HOSPITAL_COMMUNITY): Payer: Self-pay | Admitting: Certified Registered Nurse Anesthetist

## 2014-12-26 DIAGNOSIS — I483 Typical atrial flutter: Secondary | ICD-10-CM

## 2014-12-26 HISTORY — PX: ERCP: SHX5425

## 2014-12-26 HISTORY — PX: LAPAROSCOPY: SHX197

## 2014-12-26 HISTORY — PX: LAPAROSCOPIC GASTROSTOMY: SHX5896

## 2014-12-26 LAB — COMPREHENSIVE METABOLIC PANEL
ALT: 62 U/L (ref 17–63)
AST: 81 U/L — AB (ref 15–41)
Albumin: 2.8 g/dL — ABNORMAL LOW (ref 3.5–5.0)
Alkaline Phosphatase: 554 U/L — ABNORMAL HIGH (ref 38–126)
Anion gap: 7 (ref 5–15)
BUN: 12 mg/dL (ref 6–20)
CO2: 27 mmol/L (ref 22–32)
Calcium: 8.6 mg/dL — ABNORMAL LOW (ref 8.9–10.3)
Chloride: 101 mmol/L (ref 101–111)
Creatinine, Ser: 0.63 mg/dL (ref 0.61–1.24)
GFR calc Af Amer: >60 mL/min (ref >60)
Glucose, Bld: 81 mg/dL (ref 65–99)
POTASSIUM: 4.1 mmol/L (ref 3.5–5.1)
Sodium: 135 mmol/L (ref 135–145)
Total Bilirubin: 1.2 mg/dL (ref 0.3–1.2)
Total Protein: 7 g/dL (ref 6.5–8.1)

## 2014-12-26 LAB — CULTURE, BLOOD (ROUTINE X 2)
Culture: NO GROWTH
Culture: NO GROWTH

## 2014-12-26 LAB — CBC WITH DIFFERENTIAL/PLATELET
BASOS ABS: 0.2 10*3/uL — AB (ref 0.0–0.1)
BASOS PCT: 2 % — AB (ref 0–1)
EOS ABS: 0.4 10*3/uL (ref 0.0–0.7)
EOS PCT: 5 % (ref 0–5)
HEMATOCRIT: 38.7 % — AB (ref 39.0–52.0)
Hemoglobin: 12.4 g/dL — ABNORMAL LOW (ref 13.0–17.0)
LYMPHS PCT: 29 % (ref 12–46)
Lymphs Abs: 2.3 10*3/uL (ref 0.7–4.0)
MCH: 29.1 pg (ref 26.0–34.0)
MCHC: 32 g/dL (ref 30.0–36.0)
MCV: 90.8 fL (ref 78.0–100.0)
MONOS PCT: 12 % (ref 3–12)
Monocytes Absolute: 0.9 10*3/uL (ref 0.1–1.0)
NEUTROS ABS: 4.1 10*3/uL (ref 1.7–7.7)
Neutrophils Relative %: 52 % (ref 43–77)
PLATELETS: 239 10*3/uL (ref 150–400)
RBC: 4.26 MIL/uL (ref 4.22–5.81)
RDW: 15.9 % — AB (ref 11.5–15.5)
WBC: 7.9 10*3/uL (ref 4.0–10.5)

## 2014-12-26 LAB — MAGNESIUM: Magnesium: 2.1 mg/dL (ref 1.7–2.4)

## 2014-12-26 LAB — LIPASE, BLOOD: Lipase: 46 U/L (ref 22–51)

## 2014-12-26 SURGERY — ERCP, WITH INTERVENTION IF INDICATED
Anesthesia: General

## 2014-12-26 SURGERY — LAPAROSCOPY, DIAGNOSTIC
Anesthesia: General | Site: Abdomen

## 2014-12-26 MED ORDER — FENTANYL CITRATE (PF) 250 MCG/5ML IJ SOLN
INTRAMUSCULAR | Status: AC
  Filled 2014-12-26: qty 5

## 2014-12-26 MED ORDER — LIDOCAINE HCL (CARDIAC) 20 MG/ML IV SOLN
INTRAVENOUS | Status: AC
  Filled 2014-12-26: qty 5

## 2014-12-26 MED ORDER — DEXAMETHASONE SODIUM PHOSPHATE 10 MG/ML IJ SOLN
INTRAMUSCULAR | Status: DC | PRN
  Administered 2014-12-26: 10 mg via INTRAVENOUS

## 2014-12-26 MED ORDER — NEOSTIGMINE METHYLSULFATE 10 MG/10ML IV SOLN
INTRAVENOUS | Status: DC | PRN
  Administered 2014-12-26: 5 mg via INTRAVENOUS

## 2014-12-26 MED ORDER — HEPARIN SODIUM (PORCINE) 5000 UNIT/ML IJ SOLN
5000.0000 [IU] | Freq: Three times a day (TID) | INTRAMUSCULAR | Status: DC
  Administered 2014-12-26 – 2014-12-29 (×8): 5000 [IU] via SUBCUTANEOUS
  Filled 2014-12-26 (×10): qty 1

## 2014-12-26 MED ORDER — ROCURONIUM BROMIDE 100 MG/10ML IV SOLN
INTRAVENOUS | Status: AC
  Filled 2014-12-26: qty 1

## 2014-12-26 MED ORDER — HYDROMORPHONE HCL 2 MG/ML IJ SOLN
INTRAMUSCULAR | Status: AC
  Filled 2014-12-26: qty 1

## 2014-12-26 MED ORDER — ROCURONIUM BROMIDE 100 MG/10ML IV SOLN
INTRAVENOUS | Status: DC | PRN
  Administered 2014-12-26: 50 mg via INTRAVENOUS
  Administered 2014-12-26 (×3): 10 mg via INTRAVENOUS
  Administered 2014-12-26: 20 mg via INTRAVENOUS
  Administered 2014-12-26 (×2): 10 mg via INTRAVENOUS

## 2014-12-26 MED ORDER — CEFTRIAXONE SODIUM 2 G IJ SOLR
2.0000 g | INTRAMUSCULAR | Status: AC
  Administered 2014-12-26: 2 g via INTRAVENOUS

## 2014-12-26 MED ORDER — PROPOFOL 10 MG/ML IV BOLUS
INTRAVENOUS | Status: AC
  Filled 2014-12-26: qty 20

## 2014-12-26 MED ORDER — DEXTROSE 5 % IV SOLN
INTRAVENOUS | Status: AC
  Filled 2014-12-26: qty 2

## 2014-12-26 MED ORDER — METRONIDAZOLE IN NACL 5-0.79 MG/ML-% IV SOLN
500.0000 mg | Freq: Three times a day (TID) | INTRAVENOUS | Status: DC
  Administered 2014-12-26: 500 mg via INTRAVENOUS
  Filled 2014-12-26 (×2): qty 100

## 2014-12-26 MED ORDER — MIDAZOLAM HCL 5 MG/5ML IJ SOLN
INTRAMUSCULAR | Status: DC | PRN
  Administered 2014-12-26: 2 mg via INTRAVENOUS

## 2014-12-26 MED ORDER — DEXTROSE 5 % IV SOLN
2.0000 g | INTRAVENOUS | Status: DC
  Administered 2014-12-27: 2 g via INTRAVENOUS
  Filled 2014-12-26 (×2): qty 2

## 2014-12-26 MED ORDER — MIDAZOLAM HCL 2 MG/2ML IJ SOLN
INTRAMUSCULAR | Status: AC
  Filled 2014-12-26: qty 2

## 2014-12-26 MED ORDER — BUPIVACAINE-EPINEPHRINE (PF) 0.25% -1:200000 IJ SOLN
INTRAMUSCULAR | Status: DC | PRN
  Administered 2014-12-26: 5 mL

## 2014-12-26 MED ORDER — METRONIDAZOLE IN NACL 5-0.79 MG/ML-% IV SOLN
500.0000 mg | Freq: Three times a day (TID) | INTRAVENOUS | Status: DC
Start: 2014-12-27 — End: 2014-12-26

## 2014-12-26 MED ORDER — BUPIVACAINE-EPINEPHRINE (PF) 0.25% -1:200000 IJ SOLN
INTRAMUSCULAR | Status: AC
  Filled 2014-12-26: qty 30

## 2014-12-26 MED ORDER — GLYCOPYRROLATE 0.2 MG/ML IJ SOLN
INTRAMUSCULAR | Status: DC | PRN
  Administered 2014-12-26: .8 mg via INTRAVENOUS

## 2014-12-26 MED ORDER — LACTATED RINGERS IV SOLN
INTRAVENOUS | Status: DC | PRN
  Administered 2014-12-26 (×2): via INTRAVENOUS

## 2014-12-26 MED ORDER — MORPHINE SULFATE 2 MG/ML IJ SOLN
1.0000 mg | INTRAMUSCULAR | Status: DC | PRN
  Administered 2014-12-26: 2 mg via INTRAVENOUS
  Filled 2014-12-26: qty 2

## 2014-12-26 MED ORDER — SUCCINYLCHOLINE CHLORIDE 20 MG/ML IJ SOLN
INTRAMUSCULAR | Status: DC | PRN
  Administered 2014-12-26: 140 mg via INTRAVENOUS

## 2014-12-26 MED ORDER — FENTANYL CITRATE (PF) 100 MCG/2ML IJ SOLN
INTRAMUSCULAR | Status: DC | PRN
  Administered 2014-12-26 (×5): 50 ug via INTRAVENOUS

## 2014-12-26 MED ORDER — METRONIDAZOLE IN NACL 5-0.79 MG/ML-% IV SOLN
500.0000 mg | Freq: Three times a day (TID) | INTRAVENOUS | Status: DC
  Administered 2014-12-26 – 2014-12-28 (×5): 500 mg via INTRAVENOUS
  Filled 2014-12-26 (×6): qty 100

## 2014-12-26 MED ORDER — ONDANSETRON HCL 4 MG/2ML IJ SOLN
INTRAMUSCULAR | Status: AC
  Filled 2014-12-26: qty 2

## 2014-12-26 MED ORDER — TISSEEL VH 10 ML EX KIT
PACK | CUTANEOUS | Status: AC
Start: 2014-12-26 — End: 2014-12-26
  Filled 2014-12-26: qty 1

## 2014-12-26 MED ORDER — HYDROMORPHONE HCL 1 MG/ML IJ SOLN
INTRAMUSCULAR | Status: AC
  Filled 2014-12-26: qty 1

## 2014-12-26 MED ORDER — 0.9 % SODIUM CHLORIDE (POUR BTL) OPTIME
TOPICAL | Status: DC | PRN
  Administered 2014-12-26: 1000 mL

## 2014-12-26 MED ORDER — ONDANSETRON HCL 4 MG/2ML IJ SOLN
INTRAMUSCULAR | Status: DC | PRN
  Administered 2014-12-26: 4 mg via INTRAVENOUS

## 2014-12-26 MED ORDER — PROPOFOL 10 MG/ML IV BOLUS
INTRAVENOUS | Status: DC | PRN
  Administered 2014-12-26: 250 mg via INTRAVENOUS

## 2014-12-26 MED ORDER — HYDROMORPHONE HCL 1 MG/ML IJ SOLN
INTRAMUSCULAR | Status: DC | PRN
  Administered 2014-12-26 (×2): 0.5 mg via INTRAVENOUS
  Administered 2014-12-26: 1 mg via INTRAVENOUS

## 2014-12-26 MED ORDER — LACTATED RINGERS IR SOLN
Status: DC | PRN
  Administered 2014-12-26: 1000 mL
  Administered 2014-12-26: 18:00:00

## 2014-12-26 MED ORDER — PROMETHAZINE HCL 25 MG/ML IJ SOLN: 6.2500 mg | INTRAMUSCULAR | Status: DC | PRN

## 2014-12-26 MED ORDER — LIDOCAINE HCL (CARDIAC) 20 MG/ML IV SOLN
INTRAVENOUS | Status: DC | PRN
  Administered 2014-12-26: 100 mg via INTRAVENOUS

## 2014-12-26 MED ORDER — IOHEXOL 300 MG/ML  SOLN
INTRAMUSCULAR | Status: DC | PRN
  Administered 2014-12-26: 40 mL via INTRAVENOUS

## 2014-12-26 MED ORDER — DEXAMETHASONE SODIUM PHOSPHATE 10 MG/ML IJ SOLN
INTRAMUSCULAR | Status: AC
  Filled 2014-12-26: qty 1

## 2014-12-26 MED ORDER — CEFTRIAXONE SODIUM 2 G IJ SOLR
2.0000 g | INTRAMUSCULAR | Status: DC
  Filled 2014-12-26: qty 2

## 2014-12-26 MED ORDER — HYDROMORPHONE HCL 1 MG/ML IJ SOLN
0.2500 mg | INTRAMUSCULAR | Status: DC | PRN
  Administered 2014-12-26 (×2): 0.5 mg via INTRAVENOUS

## 2014-12-26 SURGICAL SUPPLY — 81 items
APPLIER CLIP 5 13 M/L LIGAMAX5 (MISCELLANEOUS) ×4
APPLIER CLIP ROT 10 11.4 M/L (STAPLE)
BLADE EXTENDED COATED 6.5IN (ELECTRODE) IMPLANT
BLADE HEX COATED 2.75 (ELECTRODE) IMPLANT
BLADE SURG SZ10 CARB STEEL (BLADE) ×4 IMPLANT
CLIP APPLIE 5 13 M/L LIGAMAX5 (MISCELLANEOUS) ×2 IMPLANT
CLIP APPLIE ROT 10 11.4 M/L (STAPLE) IMPLANT
CLOSURE WOUND 1/2 X4 (GAUZE/BANDAGES/DRESSINGS)
COVER MAYO STAND STRL (DRAPES) ×4 IMPLANT
COVER SURGICAL LIGHT HANDLE (MISCELLANEOUS) IMPLANT
CUTTER FLEX LINEAR 45M (STAPLE) ×4 IMPLANT
DECANTER SPIKE VIAL GLASS SM (MISCELLANEOUS) ×4 IMPLANT
DERMABOND ADVANCED (GAUZE/BANDAGES/DRESSINGS) ×2
DERMABOND ADVANCED .7 DNX12 (GAUZE/BANDAGES/DRESSINGS) ×2 IMPLANT
DEVICE TROCAR PUNCTURE CLOSURE (ENDOMECHANICALS) ×4 IMPLANT
DRAIN CHANNEL 19F RND (DRAIN) ×4 IMPLANT
DRAPE LAPAROSCOPIC ABDOMINAL (DRAPES) ×4 IMPLANT
DRAPE WARM FLUID 44X44 (DRAPE) IMPLANT
ELECT PENCIL ROCKER SW 15FT (MISCELLANEOUS) ×4 IMPLANT
ELECT REM PT RETURN 9FT ADLT (ELECTROSURGICAL) ×4
ELECTRODE REM PT RTRN 9FT ADLT (ELECTROSURGICAL) ×2 IMPLANT
ENDOLOOP SUT PDS II  0 18 (SUTURE) ×2
ENDOLOOP SUT PDS II 0 18 (SUTURE) ×2 IMPLANT
EVACUATOR SILICONE 100CC (DRAIN) ×4 IMPLANT
FILTER SMOKE EVAC LAPAROSHD (FILTER) IMPLANT
GAUZE SPONGE 4X4 12PLY STRL (GAUZE/BANDAGES/DRESSINGS) ×4 IMPLANT
GLOVE BIO SURGEON STRL SZ7 (GLOVE) ×4 IMPLANT
GLOVE BIO SURGEON STRL SZ7.5 (GLOVE) ×4 IMPLANT
GLOVE BIOGEL M 7.0 STRL (GLOVE) ×4 IMPLANT
GLOVE BIOGEL M STRL SZ7.5 (GLOVE) IMPLANT
GLOVE BIOGEL PI IND STRL 7.0 (GLOVE) ×8 IMPLANT
GLOVE BIOGEL PI INDICATOR 7.0 (GLOVE) ×8
GLOVE INDICATOR 8.0 STRL GRN (GLOVE) ×8 IMPLANT
GOWN STRL REUS W/ TWL XL LVL3 (GOWN DISPOSABLE) ×4 IMPLANT
GOWN STRL REUS W/TWL LRG LVL3 (GOWN DISPOSABLE) ×8 IMPLANT
GOWN STRL REUS W/TWL XL LVL3 (GOWN DISPOSABLE) ×28 IMPLANT
KIT BASIN OR (CUSTOM PROCEDURE TRAY) ×4 IMPLANT
LIGASURE IMPACT 36 18CM CVD LR (INSTRUMENTS) IMPLANT
LIQUID BAND (GAUZE/BANDAGES/DRESSINGS) IMPLANT
NS IRRIG 1000ML POUR BTL (IV SOLUTION) ×4 IMPLANT
POUCH RETRIEVAL ECOSAC 10 (ENDOMECHANICALS) ×2 IMPLANT
POUCH RETRIEVAL ECOSAC 10MM (ENDOMECHANICALS) ×2
RELOAD ENDO STITCH (ENDOMECHANICALS) ×4 IMPLANT
RELOAD ENDO STITCH 2.0 (ENDOMECHANICALS) ×4
RELOAD STAPLE TA45 3.5 REG BLU (ENDOMECHANICALS) ×4 IMPLANT
SET IRRIG TUBING LAPAROSCOPIC (IRRIGATION / IRRIGATOR) IMPLANT
SHEARS HARMONIC ACE PLUS 36CM (ENDOMECHANICALS) IMPLANT
SLEEVE XCEL OPT CAN 5 100 (ENDOMECHANICALS) ×12 IMPLANT
SOLUTION ANTI FOG 6CC (MISCELLANEOUS) ×4 IMPLANT
SPONGE DRAIN TRACH 4X4 STRL 2S (GAUZE/BANDAGES/DRESSINGS) ×4 IMPLANT
SPONGE LAP 18X18 X RAY DECT (DISPOSABLE) IMPLANT
STAPLER VISISTAT 35W (STAPLE) IMPLANT
STRIP CLOSURE SKIN 1/2X4 (GAUZE/BANDAGES/DRESSINGS) IMPLANT
SUCTION POOLE TIP (SUCTIONS) IMPLANT
SUT ETHILON 2 0 PS N (SUTURE) ×4 IMPLANT
SUT PDS AB 1 TP1 96 (SUTURE) IMPLANT
SUT PROLENE 2 0 KS (SUTURE) IMPLANT
SUT PROLENE 2 0 SH DA (SUTURE) IMPLANT
SUT RELOAD ENDO STITCH 2 48X1 (ENDOMECHANICALS) ×4
SUT SILK 2 0 (SUTURE)
SUT SILK 2 0 SH CR/8 (SUTURE) IMPLANT
SUT SILK 2-0 18XBRD TIE 12 (SUTURE) IMPLANT
SUT SILK 3 0 (SUTURE)
SUT SILK 3 0 SH CR/8 (SUTURE) IMPLANT
SUT SILK 3-0 18XBRD TIE 12 (SUTURE) IMPLANT
SUTURE RELOAD END STTCH 2 48X1 (ENDOMECHANICALS) ×4 IMPLANT
SYR 10ML ECCENTRIC (SYRINGE) ×4 IMPLANT
SYS LAPSCP GELPORT 120MM (MISCELLANEOUS)
SYSTEM LAPSCP GELPORT 120MM (MISCELLANEOUS) IMPLANT
TOWEL OR 17X26 10 PK STRL BLUE (TOWEL DISPOSABLE) ×4 IMPLANT
TRAY FOLEY CATH 16FRSI W/METER (SET/KITS/TRAYS/PACK) ×8 IMPLANT
TRAY LAPAROSCOPIC (CUSTOM PROCEDURE TRAY) ×4 IMPLANT
TROCAR ADV FIXATION 11X100MM (TROCAR) ×4 IMPLANT
TROCAR BLADELESS OPT 5 75 (ENDOMECHANICALS) ×4 IMPLANT
TROCAR XCEL 12X100 BLDLESS (ENDOMECHANICALS) ×4 IMPLANT
TROCAR XCEL BLUNT TIP 100MML (ENDOMECHANICALS) IMPLANT
TROCAR XCEL NON-BLD 11X100MML (ENDOMECHANICALS) IMPLANT
TROCAR XCEL UNIV SLVE 11M 100M (ENDOMECHANICALS) IMPLANT
TUBING INSUFFLATION 10FT LAP (TUBING) ×4 IMPLANT
YANKAUER SUCT BULB TIP 10FT TU (MISCELLANEOUS) IMPLANT
YANKAUER SUCT BULB TIP NO VENT (SUCTIONS) IMPLANT

## 2014-12-26 NOTE — Progress Notes (Signed)
Patient ID: Brett Cline, male   DOB: [birth year], 64 y.o.   MRN: [id number]    Subjective: Pt anxious for surgery, but otherwise ready to go.   Objective: Vital signs in last 24 hours: Temp:  [97.8 F (36.6 C)-98.1 F (36.7 C)] 98 F (36.7 C) (08/02 0500) Pulse Rate:  [63-79] 63 (08/02 0500) Resp:  [17-20] 20 (08/02 0500) BP: (145-161)/(79-87) 161/87 mmHg (08/02 0500) SpO2:  [97 %-100 %] 100 % (08/02 0500) Weight:  [139.844 kg (308 lb 4.8 oz)] 139.844 kg (308 lb 4.8 oz) (08/02 0500) Last BM Date: 12/25/14  Intake/Output from previous day: 08/01 0701 - 08/02 0700 In: 1640 [P.O.:240; I.V.:1100; IV Piggyback:300] Out: 2550 [Urine:2550] Intake/Output this shift:    PE: Abd: soft, NT, obese, +BS Heart: regular Lungs: CTAB  Lab Results:   Recent Labs  12/25/14 0427 12/26/14 0330  WBC 8.3 7.9  HGB 12.8* 12.4*  HCT 39.0 38.7*  PLT 249 239   BMET  Recent Labs  12/25/14 0427 12/26/14 0330  NA 135 135  K 4.2 4.1  CL 103 101  CO2 23 27  GLUCOSE 89 81  BUN 13 12  CREATININE 0.69 0.63  CALCIUM 8.7* 8.6*   PT/INR No results for input(s): LABPROT, INR in the last 72 hours. CMP     Component Value Date/Time   NA 135 12/26/2014 0330   K 4.1 12/26/2014 0330   CL 101 12/26/2014 0330   CO2 27 12/26/2014 0330   GLUCOSE 81 12/26/2014 0330   BUN 12 12/26/2014 0330   CREATININE 0.63 12/26/2014 0330   CALCIUM 8.6* 12/26/2014 0330   PROT 7.0 12/26/2014 0330   ALBUMIN 2.8* 12/26/2014 0330   AST 81* 12/26/2014 0330   ALT 62 12/26/2014 0330   ALKPHOS 554* 12/26/2014 0330   BILITOT 1.2 12/26/2014 0330   GFRNONAA >60 12/26/2014 0330   GFRAA >60 12/26/2014 0330   Lipase     Component Value Date/Time   LIPASE 46 12/26/2014 0330       Studies/Results: No results found.  Anti-infectives: Anti-infectives    Start     Dose/Rate Route Frequency Ordered Stop   12/20/14 2200  meropenem (MERREM) 1 g in sodium chloride 0.9 % 100 mL IVPB  Status:  Discontinued     1 g 200 mL/hr over 30 Minutes Intravenous Every 12 hours 12/20/14 2122 12/20/14 2155   12/20/14 2200  ciprofloxacin (CIPRO) IVPB 400 mg  Status:  Discontinued     400 mg 200 mL/hr over 60 Minutes Intravenous Every 12 hours 12/20/14 2122 12/22/14 1327   12/20/14 2200  meropenem (MERREM) 1 g in sodium chloride 0.9 % 100 mL IVPB     1 g 200 mL/hr over 30 Minutes Intravenous 3 times per day 12/20/14 2155         Assessment/Plan   1. Gallstone pancreatitis/choledocholithiasis -plan for lap chole with lap ERCP today with Dr. Elnoria Howard -transitioned abx therapy from merrem to rocephin/flagyl for E coli bacteremia per pharmacy recommendations -NPO 2. MMP, per medicine  LOS: 6 days    Kay Shippy E 12/26/2014, 8:58 AM Pager: [phone]

## 2014-12-26 NOTE — Consult Note (Signed)
[facility] Consult Note   Brett Cline ZOX:[id number] DOB: June 06, 1950 DOA: 12/20/2014 PCP: Noni Saupe., MD- no longer sees him as he was unable to pay some fees- has an appt with the [facility] coming up  Brief narrative: Brett Cline is a 64 y.o. 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 At the end of June, was transferred from [place] to [facility] for gallstone pancreatitis then discharged a few days later for follow-up with general surgery. General surgery was to call him for an appt.  Subjective: Eating well without abdominal pain. Has back pain in lumbar area. No nausea or vomiting.   Assessment/Plan: Principal Problem:   Acute gallstone pancreatitis - recurrent cholelithiasis/choledocholithiasis/Sepsis  - pancreatitis resolved- advanced to low fat diet- stop IVF - the patient was sent from [facility] [facility] yesterday for bariatric surgeons to perform cholecystectomy - surgery to perform cholecystectomy with concurrent ERCP by GI today  Active Problems: - a-flutter RVR- new issue -he has no history of this in the past - had a flutter with RVR on 7/30-HR improved after IV Lopressor- continued oral Lopressor-converted back to sinus rhythm later in the morning -  increased Lopressor to 50 BID from 25 BID- no recurrent episodes yet - ECHO mentioned below-  CHA2DS2-VASc Score 1- should not need anticoagulation  Bacteremia Escherichia coli - cultures done at [facility] -Culture report with Sensitivities in paper chart-  on Meropenam started on 7/27-will need to give antibiotics to complete a 2 week or course-can transition to orals when closer to discharge  HTN - Metoprolol 50 bid,  Lisinopril 40 daily, added Norvasc 10 on 7/31 - also on Doxazosin for BPH   BPH - cont doxazosin   Morbid obesity -Status post post-gastric bypass-   Depression -resumed Elavil, Lexapro, Wellbutrin   Chronic back pain -cont Robaxin- Norco on was hold-being given Morphine IV- have d/c'd this- resumed Norco- do not want to increase his narcotics for his chronic back pain... Added k pad, mobilize out of bed- he states his pain is better today   Right foot drop -PT eval, OOB   Asthma, chronic -Stable-continue Symbicort and PRN Xopenex     Code Status: full code Family Communication:  Disposition Plan: per surgery DVT prophylaxis: full dose lovenox Consultants:GI, gen surgery Procedures: ECHO: Impressions:  - Technically difficult; definity used; normal LV function; mild LVH; grade 1 diastolic dysfunction; moderate LAE.  Antibiotics: Anti-infectives    Start     Dose/Rate Route Frequency Ordered Stop   12/27/14 1600  cefTRIAXone (ROCEPHIN) 2 g in dextrose 5 % 50 mL IVPB     2 g 100 mL/hr over 30 Minutes Intravenous Every 24 hours 12/26/14 1810 12/29/14 1559   12/27/14 0200  metroNIDAZOLE (FLAGYL) IVPB 500 mg     500 mg 100 mL/hr over 60 Minutes Intravenous Every 8 hours 12/26/14 1810 12/29/14 0159   12/26/14 1600  cefTRIAXone (ROCEPHIN) 2 g in dextrose 5 % 50 mL IVPB  Status:  Discontinued     2 g 100 mL/hr over 30 Minutes Intravenous Every 24 hours 12/26/14 0901 12/26/14 1810   12/26/14 1302  cefTRIAXone (ROCEPHIN) 2 g in dextrose 5 % 50 mL IVPB     2 g 100 mL/hr over 30  Minutes Intravenous 30 min pre-op 12/26/14 1303 12/26/14 1415   12/26/14 1000  metroNIDAZOLE (FLAGYL) IVPB 500 mg  Status:  Discontinued     500 mg 100 mL/hr over 60 Minutes Intravenous Every 8 hours 12/26/14 0901 12/26/14 1810   12/20/14 2200  meropenem (MERREM) 1 g in sodium chloride 0.9 % 100 mL IVPB  Status:  Discontinued     1 g 200 mL/hr over 30 Minutes Intravenous Every 12 hours  12/20/14 2122 12/20/14 2155   12/20/14 2200  ciprofloxacin (CIPRO) IVPB 400 mg  Status:  Discontinued     400 mg 200 mL/hr over 60 Minutes Intravenous Every 12 hours 12/20/14 2122 12/22/14 1327   12/20/14 2200  meropenem (MERREM) 1 g in sodium chloride 0.9 % 100 mL IVPB  Status:  Discontinued     1 g 200 mL/hr over 30 Minutes Intravenous 3 times per day 12/20/14 2155 12/26/14 0901      Objective: Filed Weights   12/20/14 1710 12/25/14 0500 12/26/14 0500  Weight: 134 kg (295 lb 6.7 oz) 137.7 kg (303 lb 9.2 oz) 139.844 kg (308 lb 4.8 oz)    Intake/Output Summary (Last 24 hours) at 12/26/14 1816 Last data filed at 12/26/14 1800  Gross per 24 hour  Intake   3200 ml  Output   3270 ml  Net    -70 ml     Vitals Filed Vitals:   12/26/14 1718 12/26/14 1730 12/26/14 1745 12/26/14 1800  BP: 155/77 158/82 150/74 144/75  Pulse: 83 81 83 84  Temp: 98.8 F (37.1 C)   98 F (36.7 C)  TempSrc:      Resp: 22 14 15 14   Height:      Weight:      SpO2: 100% 100% 100% 98%    Exam:  General:  Pt is alert, not in acute distress  HEENT: No icterus, No thrush, oral mucosa moist  Cardiovascular: regular rate and rhythm, S1/S2 No murmur  Respiratory: clear to auscultation bilaterally   Abdomen: Soft, +Bowel sounds, non tender, non distended, no guarding  MSK: No LE edema, cyanosis or clubbing  Data Reviewed: Basic Metabolic Panel:  Recent Labs Lab 12/20/14 2032 12/22/14 0240 12/23/14 0510 12/24/14 0519 12/25/14 0427 12/26/14 0330  NA 135 134* 135 134* 135 135  K 4.0 3.9 3.6 3.8 4.2 4.1  CL 102 105 102 102 103 101  CO2 19* 22 24 24 23 27   GLUCOSE 123* 84 79 97 89 81  BUN 17 13 7 10 13 12   CREATININE 1.00 0.76 0.59* 0.64 0.69 0.63  CALCIUM 8.7* 8.5* 8.5* 8.3* 8.7* 8.6*  MG 1.7 1.9 1.8 2.0 2.0 2.1  PHOS 4.8*  --   --   --   --   --    Liver Function Tests:  Recent Labs Lab 12/22/14 0240 12/23/14 0510 12/24/14 0519 12/25/14 0427 12/26/14 0330  AST 74* 49* 41 49*  81*  ALT 72* 53 45 48 62  ALKPHOS 499* 503* 501* 521* 554*  BILITOT 3.3* 2.6* 1.9* 1.7* 1.2  PROT 6.3* 7.1 7.0 7.3 7.0  ALBUMIN 2.3* 2.8* 2.8* 2.9* 2.8*    Recent Labs Lab 12/22/14 0240 12/23/14 0510 12/24/14 0519 12/25/14 0427 12/26/14 0330  LIPASE 120* 82* 72* 54* 46   No results for input(s): AMMONIA in the last 168 hours. CBC:  Recent Labs Lab 12/22/14 0240 12/23/14 0510 12/24/14 0519 12/25/14 0427 12/26/14 0330  WBC 16.7* 8.6 7.7 8.3 7.9  NEUTROABS 14.7* 6.3 5.4 4.9  4.1  HGB 11.6* 12.0* 11.9* 12.8* 12.4*  HCT 34.9* 37.4* 37.3* 39.0 38.7*  MCV 89.9 88.8 89.4 90.5 90.8  PLT 202 209 213 249 239   Cardiac Enzymes:  Recent Labs Lab 12/21/14 1133 12/21/14 1810 12/21/14 2350  TROPONINI 0.23* 0.10* <0.03   BNP (last 3 results)  Recent Labs  12/21/14 1133  BNP 308.8*    ProBNP (last 3 results) No results for input(s): PROBNP in the last 8760 hours.  CBG: No results for input(s): GLUCAP in the last 168 hours.  Recent Results (from the past 240 hour(s))  MRSA PCR Screening     Status: None   Collection Time: 12/20/14  5:10 PM  Result Value Ref Range Status   MRSA by PCR NEGATIVE NEGATIVE Final    Comment:        The GeneXpert MRSA Assay (FDA approved for NASAL specimens only), is one component of a comprehensive MRSA colonization surveillance program. It is not intended to diagnose MRSA infection nor to guide or monitor treatment for MRSA infections.   Culture, blood (routine x 2)     Status: None   Collection Time: 12/21/14 11:33 AM  Result Value Ref Range Status   Specimen Description BLOOD RIGHT HAND  Final   Special Requests BOTTLES DRAWN AEROBIC ONLY 6CC  Final   Culture NO GROWTH 5 DAYS  Final   Report Status 12/26/2014 FINAL  Final  Culture, blood (routine x 2)     Status: None   Collection Time: 12/21/14 11:45 AM  Result Value Ref Range Status   Specimen Description BLOOD RIGHT HAND  Final   Special Requests BOTTLES DRAWN AEROBIC  ONLY 10CC  Final   Culture NO GROWTH 5 DAYS  Final   Report Status 12/26/2014 FINAL  Final  Culture, Urine     Status: None   Collection Time: 12/21/14 11:52 AM  Result Value Ref Range Status   Specimen Description URINE, CLEAN CATCH  Final   Special Requests NONE  Final   Culture NO GROWTH 1 DAY  Final   Report Status 12/22/2014 FINAL  Final     Studies: Dg Ercp Biliary & Pancreatic Ducts  12/26/2014   CLINICAL DATA:  Gallstone pancreatitis.  EXAM: ERCP  TECHNIQUE: Multiple spot images obtained with the fluoroscopic device and submitted for interpretation post-procedure.  COMPARISON:  CT of the abdomen and pelvis on 12/21/2014  FINDINGS: Intraoperative imaging demonstrates ERCP at the time of laparoscopic surgery. After cannulation of the common bile duct, contrast injection shows a mildly dilated common bile duct without visible filling defects. A balloon occlusion injection was also performed. No contrast extravasation identified.  IMPRESSION: Mildly dilated common bile duct without visible filling defects.  These images were submitted for radiologic interpretation only. Please see the procedural report for the amount of contrast and the fluoroscopy time utilized.   Electronically Signed   By: Irish Lack M.D.   On: 12/26/2014 15:48    Scheduled Meds:  Scheduled Meds: . amitriptyline  25 mg Oral TID  . amLODipine  10 mg Oral Daily  . antiseptic oral rinse  7 mL Mouth Rinse q12n4p  . buPROPion  150 mg Oral Daily  . [START ON 12/27/2014] cefTRIAXone (ROCEPHIN)  IV  2 g Intravenous Q24H  . chlorhexidine  15 mL Mouth Rinse BID  . doxazosin  2 mg Oral Q breakfast  . escitalopram  10 mg Oral TID  . feeding supplement (ENSURE ENLIVE)  237 mL Oral BID BM  .  heparin subcutaneous  5,000 Units Subcutaneous 3 times per day  . HYDROmorphone      . lisinopril  40 mg Oral Daily  . methocarbamol  1,000 mg Oral QID  . metoprolol tartrate  50 mg Oral BID  . [START ON 12/27/2014] metronidazole  500  mg Intravenous Q8H  . pantoprazole (PROTONIX) IV  40 mg Intravenous QHS  . potassium chloride SA  20 mEq Oral BID WC   Continuous Infusions: . lactated ringers 100 mL/hr at 12/26/14 0430    Time spent on care of this patient: 40 min   Stephane Niemann, MD 12/26/2014, 6:16 PM  LOS: 6 days   [facility] Office  [phone] Pager - Text Page per [email or web address] If 7PM-7AM, please contact night-coverage [email or web address]

## 2014-12-26 NOTE — Progress Notes (Signed)
Nutrition Follow-up  DOCUMENTATION CODES:   Non-severe (moderate) malnutrition in context of acute illness/injury, Morbid obesity  INTERVENTION:  - Continue Ensure Enlive BID once diet advanced to at least FLD - Diet advancement as medically feasible following surgery - RD will continue to monitor for needs  NUTRITION DIAGNOSIS:   Malnutrition related to altered GI function as evidenced by energy intake < 75% for > 7 days, percent weight loss (5% weight loss within one month). -ongoing, intakes resolving  GOAL:   Patient will meet greater than or equal to 90% of their needs -previously met  MONITOR:   Diet advancement, PO intake, Labs, Weight trends  ASSESSMENT:   64 y.o. male who was transferred from [facility] due to complications for the second time in the last month due to complicated gallstone pancreatitis.   Pt ate 75-100% on Heart Healthy diet since lunch 7/30. He reports very good appetite with some abdominal pain at the start of meals which he attributes to gallstone pancreatitis. He likes the chocolate Ensure and would like to continue these following surgery.  Previously meeting needs. Pt reports surgery is planned for 1400 today; ERCP with lap chole. Will educate on low-fat diet following surgery. Medications reviewed. Labs reviewed; Ca: 8.6 mg/dL.   Diet Order:  Diet NPO time specified  Skin:  Reviewed, no issues  Last BM:  8/1  Height:   Ht Readings from Last 1 Encounters:  12/20/14 '5\' 9"'  (1.753 m)    Weight:   Wt Readings from Last 1 Encounters:  12/26/14 308 lb 4.8 oz (139.844 kg)    Ideal Body Weight:  72.7 kg  BMI:  Body mass index is 45.51 kg/(m^2).  Estimated Nutritional Needs:   Kcal:  2200-2400  Protein:  110-130 gm  Fluid:  2.4 L  EDUCATION NEEDS:   Education needs no appropriate at this time     Jarome Matin, RD, LDN Inpatient Clinical Dietitian Pager # [phone] After hours/weekend pager # [phone]

## 2014-12-26 NOTE — Progress Notes (Signed)
Difficult cholecystectomy Will give a dose of subcu heparin tonight for dvt prophylaxis If cbc ok in AM and no significant drop in hgb/hct, should be ok to resume lovenox for afib  Brett Cline. Andrey Campanile, MD, FACS General, Bariatric, & Minimally Invasive Surgery [facility], [place]

## 2014-12-26 NOTE — OR Nursing (Signed)
Foley was difficult to place. Tried a 11F, 59F Coude, and ended up with a 52F pediatric foley inserted by Dr. Daphine Deutscher.

## 2014-12-26 NOTE — Anesthesia Postprocedure Evaluation (Signed)
  Anesthesia Post-op Note  Patient: Baxter Hire  Procedure(s) Performed: Procedure(s): LAPAROSCOPY DIAGNOSTIC (N/A) LAPAROSCOPIC GASTROTOMY WITH CHOLECYSTECTOMY (N/A) ENDOSCOPIC RETROGRADE CHOLANGIOPANCREATOGRAPHY (ERCP) (N/A)  Patient Location: PACU  Anesthesia Type:General  Level of Consciousness: awake  Airway and Oxygen Therapy: Patient Spontanous Breathing  Post-op Pain: mild  Post-op Assessment: Post-op Vital signs reviewed LLE Motor Response: Responds to commands, Purposeful movement LLE Sensation: Full sensation RLE Motor Response: Responds to commands, Purposeful movement RLE Sensation: Numbness, Decreased      Post-op Vital Signs: Reviewed  Last Vitals:  Filed Vitals:   12/26/14 1818  BP: 155/78  Pulse: 79  Temp: 36.8 C  Resp: 14    Complications: No apparent anesthesia complications

## 2014-12-26 NOTE — Anesthesia Procedure Notes (Signed)
Procedure Name: Intubation Date/Time: 12/26/2014 1:40 PM Performed by: Orest Dikes Pre-anesthesia Checklist: Patient identified, Emergency Drugs available, Suction available and Patient being monitored Patient Re-evaluated:Patient Re-evaluated prior to inductionOxygen Delivery Method: Circle System Utilized Preoxygenation: Pre-oxygenation with 100% oxygen Intubation Type: IV induction Ventilation: Mask ventilation without difficulty Laryngoscope Size: Mac and 4 Grade View: Grade I Tube type: Oral Tube size: 7.5 mm Number of attempts: 1 Airway Equipment and Method: Stylet Placement Confirmation: ETT inserted through vocal cords under direct vision,  positive ETCO2 and breath sounds checked- equal and bilateral Secured at: 21 cm Tube secured with: Tape Dental Injury: Teeth and Oropharynx as per pre-operative assessment

## 2014-12-26 NOTE — Progress Notes (Signed)
PT Cancellation Note  Patient Details Name: Brett Cline MRN: [id number] DOB: 07-02-1950   Cancelled Treatment:    Reason Eval/Treat Not Completed: Patient at procedure or test/unavailable, pt at surgery. Will follow.    [name] 12/26/2014, 1:52 PM [phone]

## 2014-12-26 NOTE — Transfer of Care (Signed)
Immediate Anesthesia Transfer of Care Note  Patient: Brett Cline  Procedure(s) Performed: Procedure(s): LAPAROSCOPY DIAGNOSTIC (N/A) LAPAROSCOPIC GASTROTOMY WITH CHOLECYSTECTOMY (N/A) ENDOSCOPIC RETROGRADE CHOLANGIOPANCREATOGRAPHY (ERCP) (N/A)  Patient Location: PACU  Anesthesia Type:General  Level of Consciousness: awake, alert  and oriented  Airway & Oxygen Therapy: Patient Spontanous Breathing and Patient connected to face mask oxygen  Post-op Assessment: Report given to RN and Post -op Vital signs reviewed and stable  Post vital signs: Reviewed and stable  Last Vitals:  Filed Vitals:   12/26/14 0500  BP: 161/87  Pulse: 63  Temp: 36.7 C  Resp: 20    Complications: No apparent anesthesia complications

## 2014-12-26 NOTE — Brief Op Note (Signed)
12/20/2014 - 12/26/2014  5:50 PM  PATIENT:  Brett Cline  63 y.o. male  PRE-OPERATIVE DIAGNOSIS:  Chronic calculous cholecystitis, choledocholithiasis  POST-OPERATIVE DIAGNOSIS:  Same, Fatty Liver  PROCEDURE:  Procedure(s): LAPAROSCOPY DIAGNOSTIC (N/A) LAPAROSCOPIC GASTROTOMY WITH CHOLECYSTECTOMY (N/A) LAPAROSCOPIC ASSISTED ENDOSCOPIC RETROGRADE CHOLANGIOPANCREATOGRAPHY (ERCP) (N/A)  SURGEON:  Surgeon(s) and Role:    * Luretha Murphy, MD - Assisting    * Jeani Hawking, MD    * Gaynelle Adu, MD - Primary  PHYSICIAN ASSISTANT: none  ASSISTANTS: none   ANESTHESIA:   general  EBL:  Total I/O In: 1900 [I.V.:1900] Out: 1500 [Urine:1500]  BLOOD ADMINISTERED:none  DRAINS: (74fr) Jackson-Pratt drain(s) with closed bulb suction in the RUQ/gallbladder fossa   LOCAL MEDICATIONS USED:  MARCAINE     SPECIMEN:  Source of Specimen:  gallbladder  DISPOSITION OF SPECIMEN:  PATHOLOGY  COUNTS:  YES  TOURNIQUET:  * No tourniquets in log *  DICTATION: .Other Dictation: Dictation Number [id number]  PLAN OF CARE: pacu then floor  PATIENT DISPOSITION:  PACU - hemodynamically stable.   Delay start of Pharmacological VTE agent (>24hrs) due to surgical blood loss or risk of bleeding: no

## 2014-12-26 NOTE — Care Management Note (Signed)
Case Management Note  Patient Details  Name: Brett Cline MRN: [id number] Date of Birth: [birth year]  Subjective/Objective:                    Action/Plan:   Expected Discharge Date:                 Expected Discharge Plan:  Home/Self Care  In-House Referral:     Discharge planning Services  CM Consult  Post Acute Care Choice:    Choice offered to:     DME Arranged:    DME Agency:     HH Arranged:    HH Agency:     Status of Service:  In process, will continue to follow  Medicare Important Message Given:  Yes-second notification given Date Medicare IM Given:    Medicare IM give by:    Date Additional Medicare IM Given:    Additional Medicare Important Message give by:     If discussed at Long Length of Stay Meetings, dates discussed:  12/26/14  Additional Comments:  Lanier Clam, RN 12/26/2014, 1:43 PM

## 2014-12-26 NOTE — Anesthesia Preprocedure Evaluation (Signed)
Anesthesia Evaluation  Patient identified by MRN, date of birth, ID band Patient awake    Reviewed: Allergy & Precautions, NPO status , Patient's Chart, lab work & pertinent test results  Airway Mallampati: II  TM Distance: >3 FB Neck ROM: Full    Dental   Pulmonary asthma , COPD breath sounds clear to auscultation        Cardiovascular hypertension, +CHF Rhythm:Regular Rate:Normal     Neuro/Psych    GI/Hepatic negative GI ROS, Neg liver ROS, History noted. CE   Endo/Other  negative endocrine ROS  Renal/GU negative Renal ROS     Musculoskeletal   Abdominal   Peds  Hematology   Anesthesia Other Findings   Reproductive/Obstetrics                             Anesthesia Physical Anesthesia Plan  ASA: III  Anesthesia Plan: General   Post-op Pain Management:    Induction: Intravenous  Airway Management Planned: Oral ETT  Additional Equipment:   Intra-op Plan:   Post-operative Plan: Possible Post-op intubation/ventilation  Informed Consent: I have reviewed the patients History and Physical, chart, labs and discussed the procedure including the risks, benefits and alternatives for the proposed anesthesia with the patient or authorized representative who has indicated his/her understanding and acceptance.   Dental advisory given  Plan Discussed with: CRNA and Anesthesiologist  Anesthesia Plan Comments:         Anesthesia Quick Evaluation

## 2014-12-27 ENCOUNTER — Encounter (HOSPITAL_COMMUNITY): Payer: Self-pay | Admitting: General Surgery

## 2014-12-27 LAB — CBC
HCT: 36.5 % — ABNORMAL LOW (ref 39.0–52.0)
HEMOGLOBIN: 12.4 g/dL — AB (ref 13.0–17.0)
MCH: 30.8 pg (ref 26.0–34.0)
MCHC: 34 g/dL (ref 30.0–36.0)
MCV: 90.6 fL (ref 78.0–100.0)
PLATELETS: 231 10*3/uL (ref 150–400)
RBC: 4.03 MIL/uL — ABNORMAL LOW (ref 4.22–5.81)
RDW: 15.8 % — ABNORMAL HIGH (ref 11.5–15.5)
WBC: 15.4 10*3/uL — ABNORMAL HIGH (ref 4.0–10.5)

## 2014-12-27 LAB — COMPREHENSIVE METABOLIC PANEL
ALBUMIN: 2.7 g/dL — AB (ref 3.5–5.0)
ALT: 77 U/L — AB (ref 17–63)
AST: 99 U/L — AB (ref 15–41)
Alkaline Phosphatase: 518 U/L — ABNORMAL HIGH (ref 38–126)
Anion gap: 8 (ref 5–15)
BUN: 10 mg/dL (ref 6–20)
CHLORIDE: 99 mmol/L — AB (ref 101–111)
CO2: 27 mmol/L (ref 22–32)
CREATININE: 0.54 mg/dL — AB (ref 0.61–1.24)
Calcium: 8.8 mg/dL — ABNORMAL LOW (ref 8.9–10.3)
GFR calc Af Amer: >60 mL/min (ref >60)
GFR calc non Af Amer: >60 mL/min (ref >60)
GLUCOSE: 86 mg/dL (ref 65–99)
Potassium: 4.4 mmol/L (ref 3.5–5.1)
Sodium: 134 mmol/L — ABNORMAL LOW (ref 135–145)
Total Bilirubin: 1.5 mg/dL — ABNORMAL HIGH (ref 0.3–1.2)
Total Protein: 7.1 g/dL (ref 6.5–8.1)

## 2014-12-27 LAB — MAGNESIUM: MAGNESIUM: 2 mg/dL (ref 1.7–2.4)

## 2014-12-27 MED ORDER — MORPHINE SULFATE 2 MG/ML IJ SOLN
1.0000 mg | INTRAMUSCULAR | Status: DC | PRN
Start: 2014-12-27 — End: 2014-12-29
  Administered 2014-12-27 – 2014-12-29 (×8): 2 mg via INTRAVENOUS
  Filled 2014-12-27 (×8): qty 1

## 2014-12-27 NOTE — Progress Notes (Signed)
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 ERCP 12/26/14.     PT Comments    Pt ambulated 100' with RW, appears to be near baseline with mobility. Reported 10/10 R side pain due to ERCP yesterday, he received pain medication just prior to tx.   Follow Up Recommendations  No PT follow up     Equipment Recommendations  None recommended by PT    Recommendations for Other Services       Precautions / Restrictions Precautions Precautions: Fall Precaution Comments: pt reports no falls in past year, he primarily uses his WC, but had many falls prior to use of WC Restrictions Weight Bearing Restrictions: No    Mobility  Bed Mobility               General bed mobility comments: NT-up on EOB  Transfers Overall transfer level: Needs assistance Equipment used: Rolling walker (2 wheeled) Transfers: Sit to/from Stand Sit to Stand: Min guard         General transfer comment: cues for hand placement and safety, pt pushes up on RW with BUEs  Ambulation/Gait   Ambulation Distance (Feet): 110 Feet Assistive device: Rolling walker (2 wheeled) Gait Pattern/deviations: Step-to pattern;Trunk flexed   Gait velocity interpretation: Below normal speed for age/gender General Gait Details: maintains RLE in external rotation, toe drag/drop foot on R; cues for safety especially with turns; pt does amb with significant trunk flexion at baseline   Stairs            Wheelchair Mobility    Modified Rankin (Stroke Patients Only)       Balance     Sitting balance-Leahy Scale: Good        Standing balance-Leahy Scale: Fair                      Cognition Arousal/Alertness: Awake/alert Behavior During Therapy: WFL for tasks assessed/performed;Flat affect Overall Cognitive Status: Within Functional Limits for tasks assessed                      Exercises      General Comments        Pertinent Vitals/Pain Pain Score: 10-Worst pain ever Pain Location: R side, ERCP site Pain Descriptors / Indicators: Sore Pain Intervention(s): Monitored during session;Premedicated before session;Limited activity within patient's tolerance    Home Living                      Prior Function            PT Goals (current goals can now be found in the care plan section) Acute Rehab PT Goals Patient Stated Goal: to get back to his exercises at home, return to independent PT Goal Formulation: With patient Time For Goal Achievement: 12/31/14 Potential to Achieve Goals: Good Progress towards PT goals: Progressing toward goals    Frequency  Min 3X/week    PT Plan Current plan remains appropriate    Co-evaluation             End of Session   Activity Tolerance: Patient tolerated treatment well Patient left: in chair;with  call bell/phone within reach     Time: 0948-1013 PT Time Calculation (min) (ACUTE ONLY): 25 min  Charges:  $Gait Training: 23-37 mins                    G Codes:      Tamala Ser 12/27/2014, 10:24 AM [phone]

## 2014-12-27 NOTE — Progress Notes (Signed)
1 Day Post-Op  Subjective: He looks comfortable and is ready for food, drain is bloody but old.  No new bleeding .  He did get up with PT and walk some.  He is on a walker at home short distances.  He lives with son 52 and daughter 62.  Objective: Vital signs in last 24 hours: Temp:  [97.6 F (36.4 C)-98.8 F (37.1 C)] 97.8 F (36.6 C) (08/03 0500) Pulse Rate:  [71-87] 80 (08/03 0948) Resp:  [14-22] 18 (08/03 0500) BP: (136-158)/(74-87) 136/87 mmHg (08/03 0948) SpO2:  [98 %-100 %] 98 % (08/03 0500) Weight:  [139.617 kg (307 lb 12.8 oz)] 139.617 kg (307 lb 12.8 oz) (08/03 0500) Last BM Date: 12/26/14  Intake/Output from previous day: 08/02 0701 - 08/03 0700 In: 2300 [I.V.:2100; IV Piggyback:200] Out: 2050 [Urine:1925; Drains:125] Intake/Output this shift: Total I/O In: 340 [P.O.:340] Out: 1650 [Urine:1600; Drains:50]  General appearance: alert, cooperative and no distress GI: sore, sites look fine, old blood in drain.    Lab Results:   Recent Labs  12/26/14 0330 12/27/14 0350  WBC 7.9 15.4*  HGB 12.4* 12.4*  HCT 38.7* 36.5*  PLT 239 231    BMET  Recent Labs  12/26/14 0330 12/27/14 0350  NA 135 134*  K 4.1 4.4  CL 101 99*  CO2 27 27  GLUCOSE 81 86  BUN 12 10  CREATININE 0.63 0.54*  CALCIUM 8.6* 8.8*   PT/INR No results for input(s): LABPROT, INR in the last 72 hours.   Recent Labs Lab 12/23/14 0510 12/24/14 0519 12/25/14 0427 12/26/14 0330 12/27/14 0350  AST 49* 41 49* 81* 99*  ALT 53 45 48 62 77*  ALKPHOS 503* 501* 521* 554* 518*  BILITOT 2.6* 1.9* 1.7* 1.2 1.5*  PROT 7.1 7.0 7.3 7.0 7.1  ALBUMIN 2.8* 2.8* 2.9* 2.8* 2.7*     Lipase     Component Value Date/Time   LIPASE 46 12/26/2014 0330     Studies/Results: Dg Ercp Biliary & Pancreatic Ducts  12/26/2014   CLINICAL DATA:  Gallstone pancreatitis.  EXAM: ERCP  TECHNIQUE: Multiple spot images obtained with the fluoroscopic device and submitted for interpretation post-procedure.   COMPARISON:  CT of the abdomen and pelvis on 12/21/2014  FINDINGS: Intraoperative imaging demonstrates ERCP at the time of laparoscopic surgery. After cannulation of the common bile duct, contrast injection shows a mildly dilated common bile duct without visible filling defects. A balloon occlusion injection was also performed. No contrast extravasation identified.  IMPRESSION: Mildly dilated common bile duct without visible filling defects.  These images were submitted for radiologic interpretation only. Please see the procedural report for the amount of contrast and the fluoroscopy time utilized.   Electronically Signed   By: Irish Lack M.D.   On: 12/26/2014 15:48    Medications: . amitriptyline  25 mg Oral TID  . amLODipine  10 mg Oral Daily  . antiseptic oral rinse  7 mL Mouth Rinse q12n4p  . buPROPion  150 mg Oral Daily  . cefTRIAXone (ROCEPHIN)  IV  2 g Intravenous Q24H  . chlorhexidine  15 mL Mouth Rinse BID  . doxazosin  2 mg Oral Q breakfast  . escitalopram  10 mg Oral TID  . feeding supplement (ENSURE ENLIVE)  237 mL Oral BID BM  . heparin subcutaneous  5,000 Units Subcutaneous 3 times per day  . lisinopril  40 mg Oral Daily  . methocarbamol  1,000 mg Oral QID  . metoprolol tartrate  50 mg  Oral BID  . metronidazole  500 mg Intravenous Q8H  . pantoprazole (PROTONIX) IV  40 mg Intravenous QHS  . potassium chloride SA  20 mEq Oral BID WC   . lactated ringers 100 mL/hr at 12/27/14 0514   Prior to Admission medications   Medication Sig Start Date End Date Taking? Authorizing Provider  amitriptyline (ELAVIL) 25 MG tablet Take 25 mg by mouth 3 (three) times daily.  10/25/14  Yes Historical Provider, MD  aspirin-acetaminophen-caffeine (EXCEDRIN MIGRAINE) [phone] MG per tablet Take 2 tablets by mouth every 6 (six) hours as needed for migraine.   Yes Historical Provider, MD  azelastine (ASTELIN) 0.1 % nasal spray Place 1 spray into both nostrils daily.    Yes Historical Provider, MD   buPROPion (WELLBUTRIN XL) 150 MG 24 hr tablet Take 150 mg by mouth daily.  09/22/14  Yes Historical Provider, MD  buPROPion (WELLBUTRIN XL) 300 MG 24 hr tablet Take 300 mg by mouth daily.  09/22/14  Yes Historical Provider, MD  dicyclomine (BENTYL) 20 MG tablet Take 20 mg by mouth 4 (four) times daily -  before meals and at bedtime.  09/22/14  Yes Historical Provider, MD  doxazosin (CARDURA) 2 MG tablet Take 2 mg by mouth daily with breakfast.  09/22/14  Yes Historical Provider, MD  escitalopram (LEXAPRO) 10 MG tablet Take 10 mg by mouth 3 (three) times daily.  10/25/14  Yes Historical Provider, MD  felodipine (PLENDIL) 5 MG 24 hr tablet Take 10 mg by mouth daily.   Yes Historical Provider, MD  fluticasone (FLONASE) 50 MCG/ACT nasal spray Place 1 spray into both nostrils daily as needed for allergies.  09/22/14  Yes Historical Provider, MD  furosemide (LASIX) 40 MG tablet Take 40 mg by mouth daily as needed for fluid.  11/25/14  Yes Historical Provider, MD  HYDROcodone-acetaminophen (NORCO) 10-325 MG per tablet Take 0.5-2 tablets by mouth every 6 (six) hours as needed (pain).  09/26/14  Yes Historical Provider, MD  lisinopril (PRINIVIL,ZESTRIL) 40 MG tablet Take 40 mg by mouth daily.  09/22/14  Yes Historical Provider, MD  polyvinyl alcohol (LIQUIFILM TEARS) 1.4 % ophthalmic solution Place 1 drop into both eyes every 6 (six) hours as needed for dry eyes.   Yes Historical Provider, MD  potassium chloride SA (K-DUR,KLOR-CON) 20 MEQ tablet Take 20 mEq by mouth 2 (two) times daily with a meal.  10/25/14  Yes Historical Provider, MD  SYMBICORT 160-4.5 MCG/ACT inhaler Inhale 2 puffs into the lungs 2 (two) times daily as needed (wheezing).  10/25/14  Yes Historical Provider, MD  tiZANidine (ZANAFLEX) 4 MG tablet Take 4-8 mg by mouth every 6 (six) hours as needed for muscle spasms.  11/21/14  Yes Historical Provider, MD    Assessment/Plan  Chronic calculous cholecystitis, choledocholithiasis, fatty liver Bacteremia, E  coli in blood cultures from [facility] S/p ERCP with removal of calculus/calculi, 12/26/14, Dr Elnoria Howard S/p LAPAROSCOPY DIAGNOSTIC  LAPAROSCOPIC GASTROTOMY WITH CHOLECYSTECTOMY, LAPAROSCOPIC ASSISTED ENDOSCOPIC RETROGRADE CHOLANGIOPANCREATOGRAPHY (ERCP)12/26/14, Dr. Andrey Campanile Hx of PAF - I don't see where he was on anticoagulant pre admit/sinus on admit, or discharge 11/25/14 by Medicine Hx of CHF Morbid obesity BMI 66.5 down after Roux to 41.5   Hx of polysubstance use, quit 24 years ago   Hx of MI 2002 at [facility] per pt report        Hx of Migraines about 2/week recorded 11/22/14 consult Hypertension COPD/Asthma Depression on Medications Chronic back pain/2 surgeries Right foot drop/multiple falls BPH Antibiotics:  7 days  of Meropenem, Day 2 Ceftriaxone, day 2 flagyl, Bacteremia reported from [facility]. DVT:  Heparin/SCD  Plan:  Advance his diet, Dr. Mahala Menghini is going to check with ID on how long to treat bacteremia found at [facility]. Pt reports he was not on anticoagulation for PAF at home.  He has some support at home.  Will discuss with Dr. Andrey Campanile and decide about d/c plans and if he needs drain to go home.       LOS: 7 days    Omesha Bowerman 12/27/2014

## 2014-12-27 NOTE — Op Note (Signed)
[facility] [address] [place], [address]   ERCP PROCEDURE REPORT        EXAM DATE: 15-Jan-2015  PATIENT NAME:          Brett Cline, Brett Cline          MR #: [id number] BIRTHDATE:       05/11/1951     VISIT #:     [id number] ATTENDING:     Jeani Hawking, MD     STATUS:     inpatient ASSISTANT:      Fredia Beets, and Jimmey Ralph, [place]  INDICATIONS:  The patient is a 64 yr old male here for an ERCP due to established bile duct stone(s). PROCEDURE PERFORMED:     ERCP with removal of calculus/calculi  MEDICATIONS:     General Anesthesia  CONSENT: The patient understands the risks and benefits of the procedure and understands that these risks include, but are not limited to: sedation, allergic reaction, infection, perforation and/or bleeding. Alternative means of evaluation and treatment include, among others: physical exam, x-rays, and/or surgical intervention. The patient elects to proceed with this endoscopic procedure.  DESCRIPTION OF PROCEDURE: During intra-op preparation period all mechanical & medical equipment was checked for proper function. Hand hygiene and appropriate measures for infection prevention was taken. After the risks, benefits and alternatives of the procedure were thoroughly explained, Informed was verified, confirmed and timeout was successfully executed by the treatment team. With the patient in left semi-prone position, medications were administered intravenously.The    was passed from the mouth into the esophagus and further advanced from the esophagus into the stomach. From stomach scope was directed to the second portion of the duodenum. Major papilla was aligned with the duodenoscope. The scope position was confirmed fluoroscopically. Rest of the findings/therapeutics are given below. The scope was then completely withdrawn from the patient and the procedure completed. The pulse, BP, and O2 saturation were monitored  and documented by the physician and the nursing staff throughout the entire procedure. The patient was cared for as planned according to standard protocol. The patient was then discharged to recovery in stable condition and with appropriate post procedure care. Estimated blood loss is zero unless otherwise noted in this procedure report.  The duodenoscope was advanced from the gastrostomy created by Drs. Wilson and [place], Surgery, into the second portion of the duodenum.  The ampulla was located the second portion of the duodenum and it was spontaneously draining bile.  Cannulation of the CBD was easily achieved and the guidewire was secured in the right intrahepatic ducts.  No manipulation of the PD occurred. Contrast injection revealed a dilated duct at 1.5 cm and possible filling defect(s) in the CBD.  A 1-1.2 cm sphincterotomy was created.  The extraction balloon was then advanced over the guidewire into the mid CBD.  During the first sweep a large black pigmented stone was extracted.  Three more sweeps of the CBD were performed and no other stones were extracted.  During the fourth pass and occlusion cholangiogram was performed and there was no evidence of any retained stones.  At this point the procedure was terminated.  Fluoroscopic images were obtained..    ADVERSE EVENT:     None. IMPRESSIONS:     Successful CBD stone extraction.  RECOMMENDATIONS:     1) Routine post operative care per Surgery. REPEAT EXAM:   ___________________________________ Jeani Hawking, MD eSigned:  Jeani Hawking, MD Jan 15, 2015 3:31 PM   cc:  CPT CODES:  ICD9 CODES:  The ICD and CPT codes recommended by this software are interpretations from the data that the clinical staff has captured with the software.  The verification of the translation of this report to the ICD and CPT codes and modifiers is the sole responsibility of the health care institution and practicing physician where this  report was generated.  [employer] will not be held responsible for the validity of the ICD and CPT codes included on this report.  AMA assumes no liability for data contained or not contained herein. CPT is a Publishing rights manager of the [employer].   PATIENT NAME:  Brett Cline, Brett Cline MR#: [id number]

## 2014-12-27 NOTE — Consult Note (Signed)
[facility] Consult Note   Brett Cline WUJ:[id number] DOB: 1950-08-08 DOA: 12/20/2014 PCP: Noni Saupe., MD- no longer him as he was unable to pay some fees- has an appt with the [facility] coming up  Brief narrative: Brett Cline is a 64 y.o. 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 At the end of June, was transferred from [place] to [facility] for gallstone pancreatitis then discharged a few days later for follow-up with general surgery. Re-presented with acute cholecystitis and had Lap Chole + IOL & sphincterotomy 1-1.2 cm with stone retraction. Drain was placed as well  Subjective:  10/10 pain but sitting comfortably No n/v tol some liquids No cp.   Assessment/Plan:    Acute gallstone pancreatitis - recurrent cholelithiasis/choledocholithiasis/Sepsis s/p Lap chole + IOL c stone retraction 12/26/14 - pancreatitis resolved - advanced to low fat diet as per Gen surg stop IVF - Tx from [facility] to [place] long to perform cholecystectomy  - Paroxysmal A-flutter RVR- new issue-CHA2DS2-VASc Score 1 -he has no history of this in the past - had a flutter with RVR on 7/30-HR improved after IV Lopressor- continued oral Lopressor -converted back to sinus rhythm later in the morning -  increased Lopressor to 50 BID from 25 BID- no recurrent episodes yet - ECHO as below - Does not need anticoagulation - consider OP TSH  Bacteremia Escherichia coli - cultures done at [facility] -Culture report with Sensitivities in paper chart show pan sensitivity -  was on Meropenam started on 7/27 - will need to give antibiotics to  complete a 2 week course - transition to PO augmentin 12/28/14, completion date would be 8.11.16  HTN - Metoprolol 50 bid, Lisinopril 40 daily, added Norvasc 10 on 7/31 - also on Doxazosin for BPH   BPH - cont doxazosin   Morbid obesity -Status post post-gastric bypass   Depression -resumed Elavil, Lexapro, Wellbutrin   Chronic back pain -cont Robaxin- Norco on was hold  -being given Morphine IV- have d/c'd this- resumed Norco - do not want to increase his narcotics for his chronic back pain -Added k pad, mobilize out of bed   Right foot drop -PT eval, OOB   Asthma, chronic -Stable-continue Symbicort and PRN Xopenex     Code Status: full code Family Communication:  Disposition Plan: per surgery DVT prophylaxis: DVT dosed heparin Consultants:GI, gen surgery Procedures: ECHO: Impressions:  - Technically difficult; definity used; normal LV function; mild LVH; grade 1 diastolic dysfunction; moderate LAE.  Antibiotics: As noteed above  Objective: Filed Weights   12/25/14 0500 12/26/14 0500 12/27/14 0500  Weight: 137.7 kg (303 lb 9.2 oz) 139.844 kg (308 lb 4.8 oz) 139.617 kg (307 lb 12.8 oz)    Intake/Output Summary (Last 24 hours) at 12/27/14 1032 Last data filed at 12/27/14 0954  Gross per 24 hour  Intake   2640 ml  Output   2800 ml  Net   -160 ml     Vitals Filed Vitals:   12/26/14 1818 12/26/14 2104 12/27/14 0500 12/27/14 0948  BP: 155/78 144/78 148/76 136/87  Pulse: 79 87 71 80  Temp: 98.2 F (36.8 C) 97.6 F (36.4  C) 97.8 F (36.6 C)   TempSrc:  Oral Oral   Resp: 14 19 18    Height:      Weight:   139.617 kg (307 lb 12.8 oz)   SpO2: 98% 99% 98%     Exam:  General:  Pt is alert, not in acute distress, oebse flat affect  HEENT: No icterus, No thrush, oral mucosa moist  Cardiovascular: regular rate and rhythm, S1/S2 No murmur, no jvd  Respiratory: clear to auscultation bilaterally   Abdomen: Soft, +Bowel sounds, non tender, non  distended, no guarding  MSK: No LE edema, cyanosis or clubbing  Data Reviewed: Basic Metabolic Panel:  Recent Labs Lab 12/20/14 2032  12/23/14 0510 12/24/14 0519 12/25/14 0427 12/26/14 0330 12/27/14 0350  NA 135  < > 135 134* 135 135 134*  K 4.0  < > 3.6 3.8 4.2 4.1 4.4  CL 102  < > 102 102 103 101 99*  CO2 19*  < > 24 24 23 27 27   GLUCOSE 123*  < > 79 97 89 81 86  BUN 17  < > 7 10 13 12 10   CREATININE 1.00  < > 0.59* 0.64 0.69 0.63 0.54*  CALCIUM 8.7*  < > 8.5* 8.3* 8.7* 8.6* 8.8*  MG 1.7  < > 1.8 2.0 2.0 2.1 2.0  PHOS 4.8*  --   --   --   --   --   --   < > = values in this interval not displayed. Liver Function Tests:  Recent Labs Lab 12/23/14 0510 12/24/14 0519 12/25/14 0427 12/26/14 0330 12/27/14 0350  AST 49* 41 49* 81* 99*  ALT 53 45 48 62 77*  ALKPHOS 503* 501* 521* 554* 518*  BILITOT 2.6* 1.9* 1.7* 1.2 1.5*  PROT 7.1 7.0 7.3 7.0 7.1  ALBUMIN 2.8* 2.8* 2.9* 2.8* 2.7*    Recent Labs Lab 12/22/14 0240 12/23/14 0510 12/24/14 0519 12/25/14 0427 12/26/14 0330  LIPASE 120* 82* 72* 54* 46   No results for input(s): AMMONIA in the last 168 hours. CBC:  Recent Labs Lab 12/22/14 0240 12/23/14 0510 12/24/14 0519 12/25/14 0427 12/26/14 0330 12/27/14 0350  WBC 16.7* 8.6 7.7 8.3 7.9 15.4*  NEUTROABS 14.7* 6.3 5.4 4.9 4.1  --   HGB 11.6* 12.0* 11.9* 12.8* 12.4* 12.4*  HCT 34.9* 37.4* 37.3* 39.0 38.7* 36.5*  MCV 89.9 88.8 89.4 90.5 90.8 90.6  PLT 202 209 213 249 239 231   Cardiac Enzymes:  Recent Labs Lab 12/21/14 1133 12/21/14 1810 12/21/14 2350  TROPONINI 0.23* 0.10* <0.03   BNP (last 3 results)  Recent Labs  12/21/14 1133  BNP 308.8*    ProBNP (last 3 results) No results for input(s): PROBNP in the last 8760 hours.  CBG: No results for input(s): GLUCAP in the last 168 hours.  Recent Results (from the past 240 hour(s))  MRSA PCR Screening     Status: None   Collection Time: 12/20/14  5:10 PM  Result Value Ref Range Status    MRSA by PCR NEGATIVE NEGATIVE Final    Comment:        The GeneXpert MRSA Assay (FDA approved for NASAL specimens only), is one component of a comprehensive MRSA colonization surveillance program. It is not intended to diagnose MRSA infection nor to guide or monitor treatment for MRSA infections.   Culture, blood (routine x 2)     Status: None   Collection Time: 12/21/14 11:33 AM  Result Value Ref Range Status   Specimen  Description BLOOD RIGHT HAND  Final   Special Requests BOTTLES DRAWN AEROBIC ONLY 6CC  Final   Culture NO GROWTH 5 DAYS  Final   Report Status 12/26/2014 FINAL  Final  Culture, blood (routine x 2)     Status: None   Collection Time: 12/21/14 11:45 AM  Result Value Ref Range Status   Specimen Description BLOOD RIGHT HAND  Final   Special Requests BOTTLES DRAWN AEROBIC ONLY 10CC  Final   Culture NO GROWTH 5 DAYS  Final   Report Status 12/26/2014 FINAL  Final  Culture, Urine     Status: None   Collection Time: 12/21/14 11:52 AM  Result Value Ref Range Status   Specimen Description URINE, CLEAN CATCH  Final   Special Requests NONE  Final   Culture NO GROWTH 1 DAY  Final   Report Status 12/22/2014 FINAL  Final     Studies: Dg Ercp Biliary & Pancreatic Ducts  12/26/2014   CLINICAL DATA:  Gallstone pancreatitis.  EXAM: ERCP  TECHNIQUE: Multiple spot images obtained with the fluoroscopic device and submitted for interpretation post-procedure.  COMPARISON:  CT of the abdomen and pelvis on 12/21/2014  FINDINGS: Intraoperative imaging demonstrates ERCP at the time of laparoscopic surgery. After cannulation of the common bile duct, contrast injection shows a mildly dilated common bile duct without visible filling defects. A balloon occlusion injection was also performed. No contrast extravasation identified.  IMPRESSION: Mildly dilated common bile duct without visible filling defects.  These images were submitted for radiologic interpretation only. Please see the procedural  report for the amount of contrast and the fluoroscopy time utilized.   Electronically Signed   By: Irish Lack M.D.   On: 12/26/2014 15:48    Scheduled Meds:  Scheduled Meds: . amitriptyline  25 mg Oral TID  . amLODipine  10 mg Oral Daily  . antiseptic oral rinse  7 mL Mouth Rinse q12n4p  . buPROPion  150 mg Oral Daily  . cefTRIAXone (ROCEPHIN)  IV  2 g Intravenous Q24H  . chlorhexidine  15 mL Mouth Rinse BID  . doxazosin  2 mg Oral Q breakfast  . escitalopram  10 mg Oral TID  . feeding supplement (ENSURE ENLIVE)  237 mL Oral BID BM  . heparin subcutaneous  5,000 Units Subcutaneous 3 times per day  . lisinopril  40 mg Oral Daily  . methocarbamol  1,000 mg Oral QID  . metoprolol tartrate  50 mg Oral BID  . metronidazole  500 mg Intravenous Q8H  . pantoprazole (PROTONIX) IV  40 mg Intravenous QHS  . potassium chloride SA  20 mEq Oral BID WC   Continuous Infusions: . lactated ringers 100 mL/hr at 12/27/14 1610    Time spent on care of this patient: 40 min   [name], MD [facility] [phone]

## 2014-12-27 NOTE — Op Note (Signed)
NAME:  YOUSOF, Brett Cline NO.:  [id number]  MEDICAL RECORD NO.:  [id number]  LOCATION:  1404                         FACILITY:  [facility]  PHYSICIAN:  Mary Sella. Andrey Campanile, MD, FACSDATE OF BIRTH:  [birth year]  DATE OF PROCEDURE:  12/26/2014 DATE OF DISCHARGE:                              OPERATIVE REPORT   PREOPERATIVE DIAGNOSES: 1. Chronic calculous cholecystitis. 2. Choledocholithiasis.  POSTOPERATIVE DIAGNOSES: 1. Chronic calculous cholecystitis. 2. Choledocholithiasis. 3. Fatty liver.  PROCEDURE: 1. Laparoscopic-assisted ERCP. 2. Laparoscopic gastrotomy with closure. 3. Laparoscopic cholecystectomy.  SURGEON:  Mary Sella. Andrey Campanile, MD, FACS.  ASSISTANT SURGEON:  Thornton Park. Daphine Deutscher, MD  ASSISTANT SURGEON:  Jordan Hawks. Elnoria Howard, MD who performed the ERCP.  ANESTHESIA:  General plus 0.25% Marcaine with epi.  BLOOD LOSS:  100 mL.  SPECIMENS:  Gallbladder.  DRAINS:  A 19-French Blake drain in the right upper quadrant gallbladder fossa.  INDICATIONS FOR PROCEDURE:  The patient is a pleasant 64 year old morbidly obese gentleman who underwent a laparoscopic Roux-en-Y gastric bypass at an outside institution about 14 years ago who presented to our institution about a month ago with significantly elevated LFTs and elevated lipase consistent with gallstone pancreatitis and probable choledocholithiasis.  He improved and the plan was to coordinate laparoscopic-assisted ERCP; however, he bounced back to the hospital before that could happen.  We were able to obtain Gastroenterology assistance, and so we had a long conversation with the patient prior to surgery about the plan would be to do a gastrostomy and place the ERCP scope through the remnant stomach and do an ERCP that way.  If that was unsuccessful, then we may have to do a laparoscopic common bile duct exploration or an open common bile duct exploration in addition to cholecystectomy.  We reviewed the risks and benefits  extensively with the patient, which have been documented in the medical record.  DESCRIPTION OF PROCEDURE:  He was taken to the OR 1 at [facility], placed supine on the operating room table.  General endotracheal anesthesia was established.  Foley catheter was placed; however, at his meatus at the beginning, it was essentially  partially closed, we were able to inject some KY-jelly through the meatus with ease; however, we could not pass a coude catheter into the tip of the meatus.  We were able to easily pass an 8-French Foley catheter and ultimately got urine.  His abdomen was then prepped and draped in usual standard surgical fashion with ChloraPrep.  A surgical time-out was performed.  He received IV antibiotics prior to skin incision.  I gained access to the abdomen using the Optiview technique. A 5-mm trocar with a 5-mm laparoscope was advanced through all layers of the abdominal wall in the left upper quadrant just below the subcostal margin and the abdominal cavity was entered.  Pneumoperitoneum was established.  He had no adhesions to the anterior abdominal wall; however, he had a very dilated right colon and transverse colon that actually extended above the liver on the right side.  I did place a 5-mm trocar slightly above and to the left of the umbilicus under direct visualization.  The laparoscope was put in through that trocar.  We surveilled the  upper abdomen at this point.  He had a fatty liver. There was evidence of a white circular small tube that was tethering the excluded stomach to the anterior abdominal wall high up in the left upper quadrant.  It was unclear if this had been a marking catheter that had been placed at the time of his initial surgery.  It appeared that he had a retrogastric and retrocolic Roux limb.  An 11-mm trocar was placed in the right mid abdomen and a 5-mm trocar in the lateral right abdomen. I placed an Endo-Stitch with 0-Ethibond in  the midportion of the excluded stomach a little bit away from the greater curve just below where the stomach was tethered to the abdominal wall.  I used this as a second anchoring because the proximal stomach was densely adhered to the abdominal wall. I then made a gastrotomy with hook electrocautery and got into the stomach.  We then placed a 15-mm trocar in the left upper quadrant directly through the abdominal wall and through the gastrostomy into the stomach.  At this point, we draped out that field to protect the remaining trocars just limiting the ERCP access to the 15-mm trocar.  At this point, Dr. Elnoria Howard was able to cannulate the remnant stomach and advanced it to the duodenum and visualized the papilla.  Please see his procedure note for additional details.  He did perform a sphincterotomy and was able to extract common bile duct stone.  His occlusion cholangiogram demonstrated no remaining filling defects.  The patient did have a dilated common bile duct.  We removed the temporarily placed drapes and placed new drapes around this area.  I removed the 15-mm trocar from the stomach, but left it in the abdominal cavity.  I then closed the gastrotomy with 5 interrupted 2-0 Vicryl sutures using an Endo-Stitch.  We then placed Tisseel over this.  The closure looked really nice.  We then placed the patient in reverse Trendelenburg and rotated him to the left.  The gallbladder was distended and thickened with adhesions to it and given the shear weight of his liver because of the steatosis, it was somewhat difficult to gain a lot of traction to lift up the gallbladder.  The omental adhesions were taken down with hook electrocautery as well as with blunt dissection.  He appeared to have a very long gallbladder.  We identified the node of Calot & the cystic artery, this was clearly going into the node of Calot and the gallbladder.  However, it appeared that his gallbladder continued to  go further down.  I ended up taking the cystic artery with 2 clips proximally and 1 clip distally.  We took down thin wispy fiber strands with hook electrocautery.  While trying to retract from the gallbladder, there was spillage of bile from the gallbladder.  We decided to do a dome down approach since we were not getting any traction doing a medial and lateral dissection taking down the peritoneum.  The gallbladder was freed from the gallbladder fossa using hook electrocautery.  We got into the gallbladder, but I was able to get the posterior wall off the gallbladder and get behind the posterior wall dissecting downward.  I was definitely around the infundibulum, but I did not feel comfortable dissecting any further downward.  We were able to circumferentially dissected out around the infundibulum and able to see both in front of it and behind it.  At this point, I transected the infundibulum with a laparoscopic  45-mm stapler with a blue load.  The right upper quadrant was irrigated.  The stones that had been spilled were extracted.  The right upper quadrant was copiously irrigated with 3 L of saline.  There was no additional evidence of spilled stones.  There was no evidence of additional bleeding.  A 19-French Blake drain was brought in through the left upper quadrant trocar and then brought out through the right lateral 5-mm trocar site and secured to the skin with 2-0 nylon.  The drain was placed in the gallbladder fossa.  I removed the 15-mm trocar from the left upper quadrant.  We then closed the fascial defect with an interrupted 0-Vicryl using the Endo-Close suture device. Pneumoperitoneum was released.  Remaining trocars were removed.  Skin incisions were closed in a subcuticular fashion with 4-0 Vicryl followed by sterile dressings.  All needle, instrument, and sponge counts were correct x2.  There were no immediate complications.  The patient tolerated the procedure  well.     Mary Sella. Andrey Campanile, MD, FACS     EMW/MEDQ  D:  12/26/2014  T:  12/27/2014  Job:  [id number]

## 2014-12-28 LAB — COMPREHENSIVE METABOLIC PANEL
ALBUMIN: 2.7 g/dL — AB (ref 3.5–5.0)
ALT: 60 U/L (ref 17–63)
AST: 64 U/L — ABNORMAL HIGH (ref 15–41)
Alkaline Phosphatase: 474 U/L — ABNORMAL HIGH (ref 38–126)
Anion gap: 8 (ref 5–15)
BILIRUBIN TOTAL: 1.5 mg/dL — AB (ref 0.3–1.2)
BUN: 13 mg/dL (ref 6–20)
CALCIUM: 8.3 mg/dL — AB (ref 8.9–10.3)
CO2: 27 mmol/L (ref 22–32)
CREATININE: 0.66 mg/dL (ref 0.61–1.24)
Chloride: 99 mmol/L — ABNORMAL LOW (ref 101–111)
GLUCOSE: 110 mg/dL — AB (ref 65–99)
Potassium: 4.1 mmol/L (ref 3.5–5.1)
Sodium: 134 mmol/L — ABNORMAL LOW (ref 135–145)
TOTAL PROTEIN: 6.6 g/dL (ref 6.5–8.1)

## 2014-12-28 LAB — CBC
HEMATOCRIT: 35.4 % — AB (ref 39.0–52.0)
HEMOGLOBIN: 11.2 g/dL — AB (ref 13.0–17.0)
MCH: 29 pg (ref 26.0–34.0)
MCHC: 31.6 g/dL (ref 30.0–36.0)
MCV: 91.7 fL (ref 78.0–100.0)
PLATELETS: 268 10*3/uL (ref 150–400)
RBC: 3.86 MIL/uL — AB (ref 4.22–5.81)
RDW: 16.1 % — ABNORMAL HIGH (ref 11.5–15.5)
WBC: 11.2 10*3/uL — ABNORMAL HIGH (ref 4.0–10.5)

## 2014-12-28 LAB — MAGNESIUM: Magnesium: 2 mg/dL (ref 1.7–2.4)

## 2014-12-28 MED ORDER — PANTOPRAZOLE SODIUM 40 MG PO TBEC
40.0000 mg | DELAYED_RELEASE_TABLET | Freq: Every day | ORAL | Status: DC
  Administered 2014-12-28 – 2014-12-29 (×2): 40 mg via ORAL
  Filled 2014-12-28 (×2): qty 1

## 2014-12-28 MED ORDER — AMOXICILLIN-POT CLAVULANATE 875-125 MG PO TABS
1.0000 | ORAL_TABLET | Freq: Two times a day (BID) | ORAL | Status: DC
  Administered 2014-12-28 – 2014-12-29 (×3): 1 via ORAL
  Filled 2014-12-28 (×3): qty 1

## 2014-12-28 MED ORDER — METOPROLOL TARTRATE 50 MG PO TABS: 50.0000 mg | ORAL_TABLET | Freq: Two times a day (BID) | ORAL | Status: AC

## 2014-12-28 MED ORDER — AMOXICILLIN-POT CLAVULANATE 875-125 MG PO TABS: 1.0000 | ORAL_TABLET | Freq: Two times a day (BID) | ORAL | Status: DC

## 2014-12-28 NOTE — Care Management Important Message (Signed)
Important Message  Patient Details  Name: TYHEIM VANALSTYNE MRN: [id number] Date of Birth: 12/19/1950   Medicare Important Message Given:  Yes-third notification given    Haskell Flirt 12/28/2014, 1:37 PMImportant Message  Patient Details  Name: TEAGAN HEIDRICK MRN: [id number] Date of Birth: 11-21-1950   Medicare Important Message Given:  Yes-third notification given    Haskell Flirt 12/28/2014, 1:37 PM

## 2014-12-28 NOTE — Progress Notes (Addendum)
PT Cancellation Note  Patient Details Name: Brett Cline MRN: [id number] DOB: Jan 16, 1951   Cancelled Treatment:    Reason Eval/Treat Not Completed: pt declined to participate with therapy at this time. Pt states he plans to get up and walk with nursing around 12:30 (pt prefers to wait until then;did not discourage this). Will check back another day/time. Thanks.  Rebeca Alert, MPT Pager: [phone]

## 2014-12-28 NOTE — Care Management Note (Signed)
Case Management Note  Patient Details  Name: EUNICE OLDAKER MRN: [id number] Date of Birth: Oct 29, 1950  Subjective/Objective:   [facility] chosen for HHRN-drain mgmnt. No PT cons recommended.Patient ambulates w/rw.Patient has rw,w/c.Asked about a 3n1-he will get on own.                 Action/Plan:d/c plan home w/HHC.   Expected Discharge Date:                 Expected Discharge Plan:  Home w Home Health Services  In-House Referral:     Discharge planning Services  CM Consult  Post Acute Care Choice:    Choice offered to:  Patient  DME Arranged:    DME Agency:     HH Arranged:  RN HH Agency:  [facility]  Status of Service:  In process, will continue to follow  Medicare Important Message Given:  Yes-third notification given Date Medicare IM Given:    Medicare IM give by:    Date Additional Medicare IM Given:    Additional Medicare Important Message give by:     If discussed at Long Length of Stay Meetings, dates discussed:    Additional Comments:  Lanier Clam, RN 12/28/2014, 2:13 PM

## 2014-12-28 NOTE — Consult Note (Addendum)
[facility] Consult Note   CHAE OOMMEN UJW:[id number] DOB: Apr 17, 1951 DOA: 12/20/2014 PCP: Noni Saupe., MD- no longer him as he was unable to pay some fees- has an appt with the [facility] coming up  Rec's for d/c home   Appropriate portion of the AVS filled out c the following 1. Augmentin 14 tablets -1 tablet am and pm until 01/04/15 to complete a 14 day course of Rx for Gram neg bacteremia 2. Metoprolol 50 bid [new start] for AP Aflutter--Doesn't need Anticoagulation 3. May continue his other chronic meds-would only supply very limited Rx for pain-Has a phsycian who manages his chronic pain  I will ask our secretary to set him up to see Dr. Sharyn Lull of Cardiology as an OP in 1 week for cardiology f/u  Available if questions but it sounds like patient is nearing clinical readiness for d/c--let me know if you need me to see him in am?  [name], MD [facility] [phone]    64 y/o ? 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 At the end of June, was transferred from [place] to [facility] for gallstone pancreatitis then discharged a few days later for follow-up with general surgery. Re-presented with acute cholecystitis and had Lap Chole + IOL & sphincterotomy 1-1.2 cm with stone retraction. Drain was placed as well   Subjective:  tol diet No new issues No CP Ambulated a little bit  Assessment/Plan:    Acute gallstone pancreatitis - recurrent cholelithiasis/choledocholithiasis/Sepsis s/p Lap chole + IOL c stone retraction 12/26/14 - pancreatitis resolved - advanced to  low-fat diet as per Gen surg stop IVF - Tx from [facility] to [place] [facility] to perform cholecystectomy  - Paroxysmal A-flutter RVR- new issue- CHA2DS2-VASc Score 1 -he has no history of this in the past - had a flutter with RVR on 7/30-HR improved after IV Lopressor- continued oral Lopressor -converted back to sinus rhythm later in the morning -  increased Lopressor to 50 BID from 25 BID- no recurrent episodes yet - ECHO as below - Does not need anticoagulation - consider OP TSH  Bacteremia Escherichia coli - cultures done at [facility] -Culture report with Sensitivities in paper chart show pan sensitivity -  was on Meropenam started on 7/27 - will need to give antibiotics to complete a 2 week course - transition to PO augmentin 12/28/14, completion date would be 8.11.16  HTN - Metoprolol 50 bid, Lisinopril 40 daily, added Norvasc 10 on 7/31 - also on Doxazosin for BPH   BPH - cont doxazosin   Morbid obesity -Status post post-gastric bypass   Depression -resumed Elavil, Lexapro, Wellbutrin   Chronic back pain -being given Morphine IV- have d/c'd this- resumed Norco - do not want to increase his narcotics for his chronic back pain -Added k pad, mobilize out of bed   Right foot drop -PT eval, OOB   Asthma, chronic -Stable-continue Symbicort and PRN Xopenex     Code Status: full code Family Communication:  Disposition Plan: per surgery DVT prophylaxis: DVT dosed heparin Consultants:GI, gen surgery Procedures: ECHO: Impressions:  - Technically difficult; definity used;  normal LV function; mild LVH; grade 1 diastolic dysfunction; moderate LAE.  Antibiotics: As noteed above  Objective: Filed Weights   12/26/14 0500 12/27/14 0500 12/28/14 0639  Weight: 139.844 kg (308 lb 4.8 oz) 139.617 kg (307 lb 12.8 oz) 140.933 kg (310 lb 11.2 oz)    Intake/Output Summary (Last 24 hours) at 12/28/14 1133 Last data filed at 12/28/14 0814  Gross per 24 hour  Intake    1470 ml  Output   1270 ml  Net    200 ml     Vitals Filed Vitals:   12/28/14 0215 12/28/14 0555 12/28/14 0639 12/28/14 0935  BP: 124/65 158/80  123/70  Pulse: 66 72  116  Temp: 97.7 F (36.5 C) 98.1 F (36.7 C)  98.2 F (36.8 C)  TempSrc: Oral Oral  Oral  Resp: 20 16  20   Height:      Weight:   140.933 kg (310 lb 11.2 oz)   SpO2: 97% 95%  97%    Exam:  General:  Pt is alert, not in acute distress, oebse flat affect  HEENT: No icterus, No thrush, oral mucosa moist  Cardiovascular: regular rate and rhythm, S1/S2 No murmur, no jvd  Respiratory: clear to auscultation bilaterally   abd soft NT ND  Data Reviewed: Basic Metabolic Panel:  Recent Labs Lab 12/24/14 0519 12/25/14 0427 12/26/14 0330 12/27/14 0350 12/28/14 0300  NA 134* 135 135 134* 134*  K 3.8 4.2 4.1 4.4 4.1  CL 102 103 101 99* 99*  CO2 24 23 27 27 27   GLUCOSE 97 89 81 86 110*  BUN 10 13 12 10 13   CREATININE 0.64 0.69 0.63 0.54* 0.66  CALCIUM 8.3* 8.7* 8.6* 8.8* 8.3*  MG 2.0 2.0 2.1 2.0 2.0   Liver Function Tests:  Recent Labs Lab 12/24/14 0519 12/25/14 0427 12/26/14 0330 12/27/14 0350 12/28/14 0300  AST 41 49* 81* 99* 64*  ALT 45 48 62 77* 60  ALKPHOS 501* 521* 554* 518* 474*  BILITOT 1.9* 1.7* 1.2 1.5* 1.5*  PROT 7.0 7.3 7.0 7.1 6.6  ALBUMIN 2.8* 2.9* 2.8* 2.7* 2.7*    Recent Labs Lab 12/22/14 0240 12/23/14 0510 12/24/14 0519 12/25/14 0427 12/26/14 0330  LIPASE 120* 82* 72* 54* 46   No results for input(s): AMMONIA in the last 168 hours. CBC:  Recent Labs Lab 12/22/14 0240 12/23/14 0510 12/24/14 0519 12/25/14 0427 12/26/14 0330 12/27/14 0350 12/28/14 0300  WBC 16.7* 8.6 7.7 8.3 7.9 15.4* 11.2*  NEUTROABS 14.7* 6.3 5.4 4.9 4.1  --   --   HGB 11.6* 12.0* 11.9* 12.8* 12.4* 12.4* 11.2*  HCT 34.9* 37.4* 37.3* 39.0 38.7* 36.5* 35.4*  MCV 89.9 88.8 89.4 90.5 90.8 90.6 91.7  PLT 202 209 213 249 239 231 268   Cardiac Enzymes:  Recent Labs Lab 12/21/14 1810  12/21/14 2350  TROPONINI 0.10* <0.03   BNP (last 3 results)  Recent Labs  12/21/14 1133  BNP 308.8*    ProBNP (last 3 results) No results for input(s): PROBNP in the last 8760 hours.  CBG: No results for input(s): GLUCAP in the last 168 hours.  Recent Results (from the past 240 hour(s))  MRSA PCR Screening     Status: None   Collection Time: 12/20/14  5:10 PM  Result Value Ref Range Status   MRSA by PCR NEGATIVE NEGATIVE Final    Comment:        The GeneXpert MRSA Assay (FDA approved for NASAL specimens only), is one component of  a comprehensive MRSA colonization surveillance program. It is not intended to diagnose MRSA infection nor to guide or monitor treatment for MRSA infections.   Culture, blood (routine x 2)     Status: None   Collection Time: 12/21/14 11:33 AM  Result Value Ref Range Status   Specimen Description BLOOD RIGHT HAND  Final   Special Requests BOTTLES DRAWN AEROBIC ONLY 6CC  Final   Culture NO GROWTH 5 DAYS  Final   Report Status 12/26/2014 FINAL  Final  Culture, blood (routine x 2)     Status: None   Collection Time: 12/21/14 11:45 AM  Result Value Ref Range Status   Specimen Description BLOOD RIGHT HAND  Final   Special Requests BOTTLES DRAWN AEROBIC ONLY 10CC  Final   Culture NO GROWTH 5 DAYS  Final   Report Status 12/26/2014 FINAL  Final  Culture, Urine     Status: None   Collection Time: 12/21/14 11:52 AM  Result Value Ref Range Status   Specimen Description URINE, CLEAN CATCH  Final   Special Requests NONE  Final   Culture NO GROWTH 1 DAY  Final   Report Status 12/22/2014 FINAL  Final     Studies: Dg Ercp Biliary & Pancreatic Ducts  12/26/2014   CLINICAL DATA:  Gallstone pancreatitis.  EXAM: ERCP  TECHNIQUE: Multiple spot images obtained with the fluoroscopic device and submitted for interpretation post-procedure.  COMPARISON:  CT of the abdomen and pelvis on 12/21/2014  FINDINGS: Intraoperative imaging demonstrates ERCP at the time  of laparoscopic surgery. After cannulation of the common bile duct, contrast injection shows a mildly dilated common bile duct without visible filling defects. A balloon occlusion injection was also performed. No contrast extravasation identified.  IMPRESSION: Mildly dilated common bile duct without visible filling defects.  These images were submitted for radiologic interpretation only. Please see the procedural report for the amount of contrast and the fluoroscopy time utilized.   Electronically Signed   By: Irish Lack M.D.   On: 12/26/2014 15:48    Scheduled Meds:  Scheduled Meds: . amitriptyline  25 mg Oral TID  . amLODipine  10 mg Oral Daily  . amoxicillin-clavulanate  1 tablet Oral Q12H  . antiseptic oral rinse  7 mL Mouth Rinse q12n4p  . buPROPion  150 mg Oral Daily  . chlorhexidine  15 mL Mouth Rinse BID  . doxazosin  2 mg Oral Q breakfast  . escitalopram  10 mg Oral TID  . feeding supplement (ENSURE ENLIVE)  237 mL Oral BID BM  . heparin subcutaneous  5,000 Units Subcutaneous 3 times per day  . lisinopril  40 mg Oral Daily  . methocarbamol  1,000 mg Oral QID  . metoprolol tartrate  50 mg Oral BID  . pantoprazole  40 mg Oral Daily  . potassium chloride SA  20 mEq Oral BID WC   Continuous Infusions:    Time spent on care of this patient: 40 min   [name], MD [facility] [phone]

## 2014-12-28 NOTE — Care Management Note (Signed)
Case Management Note  Patient Details  Name: YOSHIMI SARR MRN: [id number] Date of Birth: 20-Nov-1950  Subjective/Objective:HHRN-drain mgmnt ordered. Will provide [facility] list, await choice.                    Action/Plan:   Expected Discharge Date:                 Expected Discharge Plan:  Home w Home Health Services  In-House Referral:     Discharge planning Services  CM Consult  Post Acute Care Choice:    Choice offered to:  Patient  DME Arranged:    DME Agency:     HH Arranged:    HH Agency:     Status of Service:  In process, will continue to follow  Medicare Important Message Given:  Yes-second notification given Date Medicare IM Given:    Medicare IM give by:    Date Additional Medicare IM Given:    Additional Medicare Important Message give by:     If discussed at Long Length of Stay Meetings, dates discussed:  12/28/14  Additional Comments:  Lanier Clam, RN 12/28/2014, 12:59 PM

## 2014-12-28 NOTE — Discharge Summary (Signed)
Physician Discharge Summary  Patient ID: Brett Cline MRN: [id number] DOB/AGE: 1951-04-14 64 y.o.  Admit date: 12/20/2014 Discharge date: 12/29/2014  Admission Diagnoses:  Chronic calculous cholecystitis, choledocholithiasis, fatty liver Bacteremia, E coli in blood cultures from [place] Hx of PAF - CHA2DS2-VASc Score 1 Not on oral anticoagulation Hx of MI 2002 Hx of CHF Hx of Migraines about 2/week recorded 11/22/14 consult Hypertension COPD/Asthma Depression on Medications Chronic back pain/2 surgeries Right foot drop/multiple falls BPH Morbid obesity BMI 66.5-41.5 after Roux Hx of polysubstance use, quit 24 years ago Antibiotics: 6 days of Meropenem, 2 days of Ceftriaxone, 2 days of flagyl, Bacteremia reported from [place]. Switching to Augmentin today, and continue thru 01/04/15. DVT: Heparin/SCD   Discharge Diagnoses:  PAF in hospital started on Lopressor Same  Principal Problem:   Acute gallstone pancreatitis Active Problems:   Morbid obesity   Depression   Chronic back pain   Right foot drop   Asthma, chronic   Cholelithiasis without obstruction   Essential hypertension   Malnutrition of moderate degree   Atrial flutter   PROCEDURES: S/p ERCP with removal of calculus/calculi, 12/26/14, Dr Elnoria Howard S/p LAPAROSCOPY DIAGNOSTIC LAPAROSCOPIC GASTROTOMY WITH CHOLECYSTECTOMY, LAPAROSCOPIC ASSISTED ENDOSCOPIC RETROGRADE CHOLANGIOPANCREATOGRAPHY (ERCP)12/26/14, Dr. Leonides Grills Course:  The patient is a 64 year old white male who was apparently transferred here from [facility] several weeks ago with gallstone pancreatitis. He has a history of laparoscopic Roux-en-Y gastric bypass in 2008 as well as a history of congestive heart failure. He was seen at [facility] and was apparently discharged with follow-up. He never followed up and then recently had to go back to [facility] with abdominal pain, nausea, and vomiting. From [facility] apparently  his lipase was 23,000 and his white count was 45,000. His total bili is elevated at 7.6. His imaging study from [facility] suggests that he has dilated intrahepatic ducts. He was then transferred back to [facility] for more definitive management. He has no imaging or lab work in this hospital system. He was also found to have an E Coli bacteremia and was placed on antibitoics for this.  He has multiple medical issues as noted above.  He was transferred to [facility] from [facility] for surgery over the weekend 12/23/14.  Unfortunately we could not arrange GI backup/ERCP until Dr. Elnoria Howard returned.  On 12/26/14 he underwent the above noted procedures. After admit to [facility] he was noted to be in AF.  He was placed back on BB, and converted. Post op he has done well.  We have had OT and PT see him because of his hx of Drop foot and history of falls. Drainage since surgery from [facility] has been mostly old blood. Home health is being set up, to include help with the drain, he was already on Home PT from his PCP DR. Samtani from Medicine has adjusted his medicines and we are instructing patient to see pain management, cardiology, and PCP within the next 2 weeks.  He will get his drain out next week and see Dr.Wilson in 2 weeks.  Condition on d/c:  Improved  Disposition: 01-Home or Self Care     Medication List    TAKE these medications        acetaminophen 325 MG tablet  Commonly known as:  TYLENOL  Do not take more than 4000 mg of Tylenol (acetaminophen) per day.  This is in your prescribed pain medicines.  You need to keep track of your total acetaminophen dose per day to protect  your liver.     amitriptyline 25 MG tablet  Commonly known as:  ELAVIL  Take 25 mg by mouth 3 (three) times daily.     amoxicillin-clavulanate 875-125 MG per tablet  Commonly known as:  AUGMENTIN  Take 1 tablet by mouth every 12 (twelve) hours.     aspirin-acetaminophen-caffeine 250-250-65 MG per tablet  Commonly known as:  EXCEDRIN  MIGRAINE  Take 2 tablets by mouth every 6 (six) hours as needed for migraine.     azelastine 0.1 % nasal spray  Commonly known as:  ASTELIN  Place 1 spray into both nostrils daily.     buPROPion 300 MG 24 hr tablet  Commonly known as:  WELLBUTRIN XL  Take 300 mg by mouth daily.     buPROPion 150 MG 24 hr tablet  Commonly known as:  WELLBUTRIN XL  Take 150 mg by mouth daily.     dicyclomine 20 MG tablet  Commonly known as:  BENTYL  Take 20 mg by mouth 4 (four) times daily -  before meals and at bedtime.     doxazosin 2 MG tablet  Commonly known as:  CARDURA  Take 2 mg by mouth daily with breakfast.     escitalopram 10 MG tablet  Commonly known as:  LEXAPRO  Take 10 mg by mouth 3 (three) times daily.     felodipine 5 MG 24 hr tablet  Commonly known as:  PLENDIL  Take 10 mg by mouth daily.     fluticasone 50 MCG/ACT nasal spray  Commonly known as:  FLONASE  Place 1 spray into both nostrils daily as needed for allergies.     furosemide 40 MG tablet  Commonly known as:  LASIX  Take 40 mg by mouth daily as needed for fluid.     HYDROcodone-acetaminophen 10-325 MG per tablet  Commonly known as:  NORCO  Take 0.5-2 tablets by mouth every 6 (six) hours as needed (pain).     HYDROcodone-acetaminophen 5-325 MG per tablet  Commonly known as:  NORCO/VICODIN  Take 1-2 tablets by mouth every 4 (four) hours as needed for moderate pain or severe pain.     lisinopril 40 MG tablet  Commonly known as:  PRINIVIL,ZESTRIL  Take 40 mg by mouth daily.     metoprolol 50 MG tablet  Commonly known as:  LOPRESSOR  Take 1 tablet (50 mg total) by mouth 2 (two) times daily.     polyvinyl alcohol 1.4 % ophthalmic solution  Commonly known as:  LIQUIFILM TEARS  Place 1 drop into both eyes every 6 (six) hours as needed for dry eyes.     potassium chloride SA 20 MEQ tablet  Commonly known as:  K-DUR,KLOR-CON  Take 20 mEq by mouth 2 (two) times daily with a meal.     SYMBICORT 160-4.5 MCG/ACT  inhaler  Generic drug:  budesonide-formoterol  Inhale 2 puffs into the lungs 2 (two) times daily as needed (wheezing).     tiZANidine 4 MG tablet  Commonly known as:  ZANAFLEX  Take 4-8 mg by mouth every 6 (six) hours as needed for muscle spasms.       Follow-up Information    Follow up with [name] [place] On 01/02/2015.   Why:  You have an appointment to have your drain pulled on 01/02/15, be there at 2:30 PM for check in.  Either before or after your drain is pulled go to [facility] 2nd floor of our bulding and have lab work drawn.  Contact information:   [address] [place] [address] [phone]      Follow up with Atilano Ina, MD On 01/11/2015.   Specialty:  General Surgery   Why:  Your appointment is at 4 PM, be there 30 minutes early for check in.   Contact information:   [address] [place] [address] [phone]       Follow up with [facility] Valrie Hart., MD. Schedule an appointment as soon as possible for a visit in 1 week.   Specialty:  Family Medicine   Why:  CAll and make an appoinment for medication review after you get home.  Some of your medicines have changed.   Contact information:   [address] [place] [address] [phone]       Follow up with [facility].   Why:  [facility]   Contact information:   [address] [place] [address] [phone]       Schedule an appointment as soon as possible for a visit with Dawayne Cirri, MD.   Specialty:  Psychiatry   Why:  Follow up for your chronic pain issues.   Contact information:   [address] [facility] [address] [phone]       Schedule an appointment as soon as possible for a visit with Rinaldo Cloud, MD.   Specialty:  Cardiology   Why:  1-2 weeks for follow up of your cardiac issues.     Contact information:   [address] [place]  [address] [phone]       Signed: Sherrie George 12/29/2014, 10:12 AM

## 2014-12-28 NOTE — Discharge Instructions (Signed)
Laparoscopic Cholecystectomy, Care After °Refer to this sheet in the next few weeks. These instructions provide you with information on caring for yourself after your procedure. Your health care provider may also give you more specific instructions. Your treatment has been planned according to current medical practices, but problems sometimes occur. Call your health care provider if you have any problems or questions after your procedure. °WHAT TO EXPECT AFTER THE PROCEDURE °After your procedure, it is typical to have the following: °· Pain at your incision sites. You will be given pain medicines to control the pain. °· Mild nausea or vomiting. This should improve after the first 24 hours. °· Bloating and possibly shoulder pain from the gas used during the procedure. This will improve after the first 24 hours. °HOME CARE INSTRUCTIONS  °· Change bandages (dressings) as directed by your health care provider. °· Keep the wound dry and clean. You may wash the wound gently with soap and water. Gently blot or dab the area dry. °· Do not take baths or use swimming pools or hot tubs for 2 weeks or until your health care provider approves. °· Only take over-the-counter or prescription medicines as directed by your health care provider. °· Continue your normal diet as directed by your health care provider. °· Do not lift anything heavier than 10 pounds (4.5 kg) until your health care provider approves. °· Do not play contact sports for 1 week or until your health care provider approves. °SEEK MEDICAL CARE IF:  °· You have redness, swelling, or increasing pain in the wound. °· You notice yellowish-white fluid (pus) coming from the wound. °· You have drainage from the wound that lasts longer than 1 day. °· You notice a bad smell coming from the wound or dressing. °· Your surgical cuts (incisions) break open. °SEEK IMMEDIATE MEDICAL CARE IF:  °· You develop a rash. °· You have difficulty breathing. °· You have chest pain. °· You  have a fever. °· You have increasing pain in the shoulders (shoulder strap areas). °· You have dizzy episodes or faint while standing. °· You have severe abdominal pain. °· You feel sick to your stomach (nauseous) or throw up (vomit) and this lasts for more than 1 day. °Document Released: 05/12/2005 Document Revised: 03/02/2013 Document Reviewed: 12/22/2012 °ExitCare® Patient Information ©2015 [employer]. This information is not intended to replace advice given to you by your health care provider. Make sure you discuss any questions you have with your health care provider. ° °[name] ______[facility] °LAPAROSCOPIC SURGERY: POST OP INSTRUCTIONS °Always review your discharge instruction sheet given to you by the facility where your surgery was performed. °IF YOU HAVE DISABILITY OR FAMILY LEAVE FORMS, YOU MUST BRING THEM TO THE OFFICE FOR PROCESSING.   °DO NOT GIVE THEM TO YOUR DOCTOR. ° °1. A prescription for pain medication may be given to you upon discharge.  Take your pain medication as prescribed, if needed.  If narcotic pain medicine is not needed, then you may take acetaminophen (Tylenol) or ibuprofen (Advil) as needed. °2. Take your usually prescribed medications unless otherwise directed. °3. If you need a refill on your pain medication, please contact your pharmacy.  They will contact our office to request authorization. Prescriptions will not be filled after 5pm or on week-ends. °4. You should follow a light diet the first few days after arrival home, such as soup and crackers, etc.  Be sure to include lots of fluids daily. °5. Most patients will experience some   swelling and bruising in the area of the incisions.  Ice packs will help.  Swelling and bruising can take several days to resolve.  °6. It is common to experience some constipation if taking pain medication after surgery.  Increasing fluid intake and taking a stool softener (such as Colace) will usually help or prevent this problem  from occurring.  A mild laxative (Milk of Magnesia or Miralax) should be taken according to package instructions if there are no bowel movements after 48 hours. °7. Unless discharge instructions indicate otherwise, you may remove your bandages 24-48 hours after surgery, and you may shower at that time.  You may have steri-strips (small skin tapes) in place directly over the incision.  These strips should be left on the skin for 7-10 days.  If your surgeon used skin glue on the incision, you may shower in 24 hours.  The glue will flake off over the next 2-3 weeks.  Any sutures or staples will be removed at the office during your follow-up visit. °8. ACTIVITIES:  You may resume regular (light) daily activities beginning the next day--such as daily self-care, walking, climbing stairs--gradually increasing activities as tolerated.  You may have sexual intercourse when it is comfortable.  Refrain from any heavy lifting or straining until approved by your doctor. °a. You may drive when you are no longer taking prescription pain medication, you can comfortably wear a seatbelt, and you can safely maneuver your car and apply brakes. °b. RETURN TO WORK:  __________________________________________________________ °9. You should see your doctor in the office for a follow-up appointment approximately 2-3 weeks after your surgery.  Make sure that you call for this appointment within a day or two after you arrive home to insure a convenient appointment time. °10. OTHER INSTRUCTIONS: __________________________________________________________________________________________________________________________ __________________________________________________________________________________________________________________________ °WHEN TO CALL YOUR DOCTOR: °1. Fever over 101.0 °2. Inability to urinate °3. Continued bleeding from incision. °4. Increased pain, redness, or drainage from the incision. °5. Increasing abdominal pain ° °The  clinic staff is available to answer your questions during regular business hours.  Please don’t hesitate to call and ask to speak to one of the nurses for clinical concerns.  If you have a medical emergency, go to the nearest emergency room or call 911.  A surgeon from [facility] is always on call at the hospital. °[address], [address], [place], [place]  [address] ? [address] [address], , [place]   [address] °[phone] ? [phone] ? FAX [phone] °Web site: [email or web address] °

## 2014-12-28 NOTE — Evaluation (Signed)
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 ERCP 12/26/14.    Clinical Impression   Provided education on AE options including toilet aid and also DME of a tub transfer bench. Pt is very pleasant and motivated. Will follow on acute to progress ADL independence. Pt declines a HH aide and states his daughter can help him.     Follow Up Recommendations  Home health OT;Supervision - Intermittent    Equipment Recommendations  3 in 1 bedside comode (wide)    Recommendations for Other Services       Precautions / Restrictions Precautions Precautions: Fall Precaution Comments: pt reports no falls in past year, he primarily uses his WC, but had many falls prior to use of WC      Mobility Bed Mobility Overal bed mobility: Needs Assistance Bed Mobility: Supine to Sit     Supine to sit: Min guard;HOB elevated        Transfers Overall transfer level: Needs assistance Equipment used: Rolling walker (2 wheeled) Transfers: Sit to/from Stand Sit to Stand: Min guard         General transfer comment: close guard for safety. cues hand placement.    Balance                                            ADL Overall ADL's : Needs assistance/impaired Eating/Feeding: Independent;Sitting   Grooming: Wash/dry hands;Set up;Sitting   Upper Body Bathing: Set up;Sitting   Lower Body Bathing: Minimal assistance;Sit to/from stand   Upper Body Dressing : Set up;Sitting   Lower Body Dressing: Minimal assistance;Sit to/from stand   Toilet Transfer: Min guard;Ambulation;Comfort height  toilet;Grab bars;RW   Toileting- Architect and Hygiene: Min guard;Sit to/from stand         General ADL Comments: Pt states he sponge bathes at home. He used to have a sock aid but doesnt anymore. He states only in the last few days at home has he been able to don socks himself. He uses a towel today to "seesaw' to wash periarea after BM which is how he performed task today. Close guard for safety as pt had underwear down around his ankles when standing to wipe. Discussed toilet aid option to help with hygiene and coverage and pt will check into this option. Also discussed tub transfer bench as pt states he has not gotten into shower in a long time due to not being able to step over tub. Showed pt pictures of both toilet aid and bench. Pt used bilateral grab bar to sit on commode and therefore sat on commode at an angle to be able to reaCh both bars. Discussed 3in1 for safety at home as he has a low toilet. He states it will not fit around commode but would like to have one to use in his room initially. he states his daughter can help him at home initially and declines a HH aide.      Vision     Perception     Praxis      Pertinent Vitals/Pain  Pain Assessment: No/denies pain     Hand Dominance     Extremity/Trunk Assessment Upper Extremity Assessment Upper Extremity Assessment: Overall WFL for tasks assessed           Communication Communication Communication: No difficulties   Cognition Arousal/Alertness: Awake/alert Behavior During Therapy: WFL for tasks assessed/performed Overall Cognitive Status: Within Functional Limits for tasks assessed                     General Comments       Exercises       Shoulder Instructions      Home Living Family/patient expects to be discharged to:: Private residence Living Arrangements: Children (son and dtr)   Type of Home: House Home Access: Stairs to enter [employer] of Steps: 4' step from  driveway to porch   Home Layout: One level     Bathroom Shower/Tub:  (sponge bathes)   Bathroom Toilet: Standard (uses grab bar on tub to stand from commode)     Home Equipment: Environmental consultant - 2 wheels;Wheelchair - manual;Hospital bed;Grab bars - tub/shower   Additional Comments: pt states he used to have a sock aid.      Prior Functioning/Environment Level of Independence: Independent with assistive device(s);Needs assistance  Gait / Transfers Assistance Needed: amb with RW ADL's / Homemaking Assistance Needed: assist with RLE socks and shoes d/t drop foot per pt        OT Diagnosis: Generalized weakness   OT Problem List: Decreased strength;Decreased knowledge of use of DME or AE   OT Treatment/Interventions: Self-care/ADL training;Patient/family education;Therapeutic activities;DME and/or AE instruction    OT Goals(Current goals can be found in the care plan section) Acute Rehab OT Goals Patient Stated Goal: return to his exercises program at home.  OT Goal Formulation: With patient Time For Goal Achievement: 01/11/15 Potential to Achieve Goals: Good  OT Frequency: Min 2X/week   Barriers to D/C:            Co-evaluation              End of Session Equipment Utilized During Treatment: Rolling walker  Activity Tolerance: Patient tolerated treatment well Patient left: in chair;with call bell/phone within reach   Time: 1020-1050 OT Time Calculation (min): 30 min Charges:  OT General Charges $OT Visit: 1 Procedure OT Evaluation $Initial OT Evaluation Tier I: 1 Procedure OT Treatments $Therapeutic Activity: 8-22 mins G-Codes:    Lennox Laity  [phone] 12/28/2014, 12:17 PM

## 2014-12-28 NOTE — Progress Notes (Signed)
2 Days Post-Op  Subjective: He is feeling OK, got up and walked some yesterday.  Tolerating PO's.  Sites OK,  Drainage is still a little bloody in appearance.    Objective: Vital signs in last 24 hours: Temp:  [97.7 F (36.5 C)-99.6 F (37.6 C)] 98.2 F (36.8 C) (08/04 0935) Pulse Rate:  [66-116] 116 (08/04 0935) Resp:  [16-20] 20 (08/04 0935) BP: (109-158)/(54-80) 123/70 mmHg (08/04 0935) SpO2:  [95 %-98 %] 97 % (08/04 0935) Weight:  [140.933 kg (310 lb 11.2 oz)] 140.933 kg (310 lb 11.2 oz) (08/04 0639) Last BM Date: 12/27/14 1540 PO  Diet:  Heart healthy Good urine output + BM Afebrile, VSS WBC better, bilirubin still up some. Intake/Output from previous day: 08/03 0701 - 08/04 0700 In: 1690 [P.O.:1540; IV Piggyback:150] Out: 2905 [Urine:2750; Drains:155] Intake/Output this shift: Total I/O In: 120 [P.O.:120] Out: 15 [Drains:15]  General appearance: alert, cooperative and no distress Resp: clear to auscultation bilaterally GI: soft sore, sites OK, drainage is still a bit bloody in appearance  Lab Results:   Recent Labs  12/27/14 0350 12/28/14 0300  WBC 15.4* 11.2*  HGB 12.4* 11.2*  HCT 36.5* 35.4*  PLT 231 268    BMET  Recent Labs  12/27/14 0350 12/28/14 0300  NA 134* 134*  K 4.4 4.1  CL 99* 99*  CO2 27 27  GLUCOSE 86 110*  BUN 10 13  CREATININE 0.54* 0.66  CALCIUM 8.8* 8.3*   PT/INR No results for input(s): LABPROT, INR in the last 72 hours.   Recent Labs Lab 12/24/14 0519 12/25/14 0427 12/26/14 0330 12/27/14 0350 12/28/14 0300  AST 41 49* 81* 99* 64*  ALT 45 48 62 77* 60  ALKPHOS 501* 521* 554* 518* 474*  BILITOT 1.9* 1.7* 1.2 1.5* 1.5*  PROT 7.0 7.3 7.0 7.1 6.6  ALBUMIN 2.8* 2.9* 2.8* 2.7* 2.7*     Lipase     Component Value Date/Time   LIPASE 46 12/26/2014 0330     Studies/Results: Dg Ercp Biliary & Pancreatic Ducts  12/26/2014   CLINICAL DATA:  Gallstone pancreatitis.  EXAM: ERCP  TECHNIQUE: Multiple spot images obtained  with the fluoroscopic device and submitted for interpretation post-procedure.  COMPARISON:  CT of the abdomen and pelvis on 12/21/2014  FINDINGS: Intraoperative imaging demonstrates ERCP at the time of laparoscopic surgery. After cannulation of the common bile duct, contrast injection shows a mildly dilated common bile duct without visible filling defects. A balloon occlusion injection was also performed. No contrast extravasation identified.  IMPRESSION: Mildly dilated common bile duct without visible filling defects.  These images were submitted for radiologic interpretation only. Please see the procedural report for the amount of contrast and the fluoroscopy time utilized.   Electronically Signed   By: Irish Lack M.D.   On: 12/26/2014 15:48    Medications: . amitriptyline  25 mg Oral TID  . amLODipine  10 mg Oral Daily  . antiseptic oral rinse  7 mL Mouth Rinse q12n4p  . buPROPion  150 mg Oral Daily  . cefTRIAXone (ROCEPHIN)  IV  2 g Intravenous Q24H  . chlorhexidine  15 mL Mouth Rinse BID  . doxazosin  2 mg Oral Q breakfast  . escitalopram  10 mg Oral TID  . feeding supplement (ENSURE ENLIVE)  237 mL Oral BID BM  . heparin subcutaneous  5,000 Units Subcutaneous 3 times per day  . lisinopril  40 mg Oral Daily  . methocarbamol  1,000 mg Oral QID  .  metoprolol tartrate  50 mg Oral BID  . metronidazole  500 mg Intravenous Q8H  . pantoprazole (PROTONIX) IV  40 mg Intravenous QHS  . potassium chloride SA  20 mEq Oral BID WC     Prior to Admission medications   Medication Sig Start Date End Date Taking? Authorizing Provider  amitriptyline (ELAVIL) 25 MG tablet Take 25 mg by mouth 3 (three) times daily.  10/25/14  Yes Historical Provider, MD  aspirin-acetaminophen-caffeine (EXCEDRIN MIGRAINE) [phone] MG per tablet Take 2 tablets by mouth every 6 (six) hours as needed for migraine.   Yes Historical Provider, MD  azelastine (ASTELIN) 0.1 % nasal spray Place 1 spray into both nostrils daily.     Yes Historical Provider, MD  buPROPion (WELLBUTRIN XL) 150 MG 24 hr tablet Take 150 mg by mouth daily.  09/22/14  Yes Historical Provider, MD  buPROPion (WELLBUTRIN XL) 300 MG 24 hr tablet Take 300 mg by mouth daily.  09/22/14  Yes Historical Provider, MD  dicyclomine (BENTYL) 20 MG tablet Take 20 mg by mouth 4 (four) times daily -  before meals and at bedtime.  09/22/14  Yes Historical Provider, MD  doxazosin (CARDURA) 2 MG tablet Take 2 mg by mouth daily with breakfast.  09/22/14  Yes Historical Provider, MD  escitalopram (LEXAPRO) 10 MG tablet Take 10 mg by mouth 3 (three) times daily.  10/25/14  Yes Historical Provider, MD  felodipine (PLENDIL) 5 MG 24 hr tablet Take 10 mg by mouth daily.   Yes Historical Provider, MD  fluticasone (FLONASE) 50 MCG/ACT nasal spray Place 1 spray into both nostrils daily as needed for allergies.  09/22/14  Yes Historical Provider, MD  furosemide (LASIX) 40 MG tablet Take 40 mg by mouth daily as needed for fluid.  11/25/14  Yes Historical Provider, MD  HYDROcodone-acetaminophen (NORCO) 10-325 MG per tablet Take 0.5-2 tablets by mouth every 6 (six) hours as needed (pain).  09/26/14  Yes Historical Provider, MD  lisinopril (PRINIVIL,ZESTRIL) 40 MG tablet Take 40 mg by mouth daily.  09/22/14  Yes Historical Provider, MD  polyvinyl alcohol (LIQUIFILM TEARS) 1.4 % ophthalmic solution Place 1 drop into both eyes every 6 (six) hours as needed for dry eyes.   Yes Historical Provider, MD  potassium chloride SA (K-DUR,KLOR-CON) 20 MEQ tablet Take 20 mEq by mouth 2 (two) times daily with a meal.  10/25/14  Yes Historical Provider, MD  SYMBICORT 160-4.5 MCG/ACT inhaler Inhale 2 puffs into the lungs 2 (two) times daily as needed (wheezing).  10/25/14  Yes Historical Provider, MD  tiZANidine (ZANAFLEX) 4 MG tablet Take 4-8 mg by mouth every 6 (six) hours as needed for muscle spasms.  11/21/14  Yes Historical Provider, MD     Assessment/Plan Chronic calculous cholecystitis,  choledocholithiasis, fatty liver Bacteremia, E coli in blood cultures from [place] S/p ERCP with removal of calculus/calculi, 12/26/14, Dr Elnoria Howard S/p LAPAROSCOPY DIAGNOSTIC LAPAROSCOPIC GASTROTOMY WITH CHOLECYSTECTOMY, LAPAROSCOPIC ASSISTED ENDOSCOPIC RETROGRADE CHOLANGIOPANCREATOGRAPHY (ERCP)12/26/14, Dr. 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  Switching to Augmentin today, and continue thru 01/04/15.  DVT: Heparin/SCD       PLan:  I have switched him over to Augmentin as recommended by Dr. Mahala Menghini, continue to mobilize,  He lives at home with teen age children, so he doesn't have allot of close support.  Plan to let him go home tomorrow with home health to assist with drain.  We will get him set up to return to the office next week for drain removal and the Dr. Andrey Campanile the following week.  I will have him recheck his labs when he returns for his drain removal. He says he is not on the Lopressor at home, but is here. Started for some flutter.  I expect he will go home on this and it can be adjusted as needed after he sees PCP. I will follow discharge meds as recommended by Dr. Kizzie Bane.  PT and OT to see patient.  No follow up from PT recommended.  He is getting some home PT.   Noni Saupe., MD:  PCP   LOS: 8 days    Brett Cline 12/28/2014

## 2014-12-29 DIAGNOSIS — Z23 Encounter for immunization: Secondary | ICD-10-CM | POA: Diagnosis not present

## 2014-12-29 LAB — MAGNESIUM: Magnesium: 2 mg/dL (ref 1.7–2.4)

## 2014-12-29 MED ORDER — ACETAMINOPHEN 325 MG PO TABS: ORAL_TABLET | ORAL | Status: AC

## 2014-12-29 MED ORDER — HYDROCODONE-ACETAMINOPHEN 5-325 MG PO TABS: 1.0000 | ORAL_TABLET | ORAL | Status: DC | PRN

## 2014-12-29 NOTE — Progress Notes (Signed)
3 Days Post-Op  Subjective: Sleeping this AM, comfortable, sites all look good, bloody, but mostly serous drainage from the JP. He walked, had BM and is tolerating diet well.  Objective: Vital signs in last 24 hours: Temp:  [98 F (36.7 C)-98.7 F (37.1 C)] 98.7 F (37.1 C) (08/05 0516) Pulse Rate:  [70-92] 70 (08/05 0516) Resp:  [19-20] 19 (08/05 0516) BP: (130-138)/(65-81) 138/81 mmHg (08/05 0516) SpO2:  [96 %-98 %] 97 % (08/05 0516) Weight:  [141.522 kg (312 lb)] 141.522 kg (312 lb) (08/05 0516) Last BM Date: 12/28/14  Intake/Output from previous day: 08/04 0701 - 08/05 0700 In: 1260 [P.O.:1200] Out: 3255 [Urine:3200; Drains:55] Intake/Output this shift: Total I/O In: 240 [P.O.:240] Out: -   General appearance: alert, cooperative and no distress Resp: clear to auscultation bilaterally GI: soft, still sore, drain site OK, drainage is mostly serous.  sites all look fine..  +BS, no distension  Lab Results:   Recent Labs  12/27/14 0350 12/28/14 0300  WBC 15.4* 11.2*  HGB 12.4* 11.2*  HCT 36.5* 35.4*  PLT 231 268    BMET  Recent Labs  12/27/14 0350 12/28/14 0300  NA 134* 134*  K 4.4 4.1  CL 99* 99*  CO2 27 27  GLUCOSE 86 110*  BUN 10 13  CREATININE 0.54* 0.66  CALCIUM 8.8* 8.3*   PT/INR No results for input(s): LABPROT, INR in the last 72 hours.   Recent Labs Lab 12/24/14 0519 12/25/14 0427 12/26/14 0330 12/27/14 0350 12/28/14 0300  AST 41 49* 81* 99* 64*  ALT 45 48 62 77* 60  ALKPHOS 501* 521* 554* 518* 474*  BILITOT 1.9* 1.7* 1.2 1.5* 1.5*  PROT 7.0 7.3 7.0 7.1 6.6  ALBUMIN 2.8* 2.9* 2.8* 2.7* 2.7*     Lipase     Component Value Date/Time   LIPASE 46 12/26/2014 0330     Studies/Results: No results found.  Medications: . amitriptyline  25 mg Oral TID  . amLODipine  10 mg Oral Daily  . amoxicillin-clavulanate  1 tablet Oral Q12H  . antiseptic oral rinse  7 mL Mouth Rinse q12n4p  . buPROPion  150 mg Oral Daily  . chlorhexidine   15 mL Mouth Rinse BID  . doxazosin  2 mg Oral Q breakfast  . escitalopram  10 mg Oral TID  . feeding supplement (ENSURE ENLIVE)  237 mL Oral BID BM  . heparin subcutaneous  5,000 Units Subcutaneous 3 times per day  . lisinopril  40 mg Oral Daily  . methocarbamol  1,000 mg Oral QID  . metoprolol tartrate  50 mg Oral BID  . pantoprazole  40 mg Oral Daily  . potassium chloride SA  20 mEq Oral BID WC    Assessment/Plan Chronic calculous cholecystitis, choledocholithiasis, fatty liver Bacteremia, E coli in blood cultures from [place] S/p ERCP with removal of calculus/calculi, 12/26/14, Dr Elnoria Howard S/p LAPAROSCOPY DIAGNOSTIC LAPAROSCOPIC GASTROTOMY WITH CHOLECYSTECTOMY, LAPAROSCOPIC ASSISTED ENDOSCOPIC RETROGRADE CHOLANGIOPANCREATOGRAPHY (ERCP)12/26/14, Dr. 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 Switching to Augmentin 12/28/14, and continue thru 01/04/15. DVT: Heparin/SCD  Appropriate portion of the AVS filled out c the following 1. Augmentin 14 tablets -1 tablet am and pm until 01/04/15 to complete a 14 day course of Rx for Gram neg bacteremia 2. Metoprolol 50 bid [new start] for AP Aflutter--Doesn't need Anticoagulation 3. May continue his other chronic meds-would only supply very limited Rx for pain-Has a phsycian who manages his chronic pain  I will ask our secretary to set him up to see Dr. Sharyn Lull of Cardiology as an OP in 1 week for cardiology f/u  Available if questions but it sounds like patient is  nearing clinical readiness for d/c--let me know if you need me to see him in am?  [name], MD   Plan:  We will let him go home today, he was instructed to follow up with PCP, he owes money so they will not see him. He plans to follow up with [facility] in [place].  I will send info to PCP listed and they can get records from there.Dr. Mahala Menghini has also started to get him follow up with Dr. Sharyn Lull hopefully next week. He is doing fine from his surgery and ERCP.  Labs next week also.      LOS: 9 days    Brett Cline 12/29/2014

## 2014-12-29 NOTE — Care Management Note (Signed)
Case Management Note  Patient Details  Name: SCOUT GUMBS MRN: [id number] Date of Birth: April 02, 1951  Subjective/Objective: [facility] rep Kristen aware of HHC orders. Await d/c order.                   Action/Plan:d/c plan home w/HHC. No further d/c needs.   Expected Discharge Date:                 Expected Discharge Plan:  Home w Home Health Services  In-House Referral:     Discharge planning Services  CM Consult  Post Acute Care Choice:    Choice offered to:  Patient  DME Arranged:    DME Agency:     HH Arranged:  RN HH Agency:  [facility]  Status of Service:  Completed, signed off  Medicare Important Message Given:  Yes-third notification given Date Medicare IM Given:    Medicare IM give by:    Date Additional Medicare IM Given:    Additional Medicare Important Message give by:     If discussed at Long Length of Stay Meetings, dates discussed:    Additional Comments:  Lanier Clam, RN 12/29/2014, 10:26 AM

## 2014-12-29 NOTE — Progress Notes (Signed)
12/29/14  1130  Reviewed discharge instructions with patient and his daughter. Both verbalized understanding of discharge instructions.  Copy of discharge instructions, prescription, and note for CMP given to patient.

## 2015-01-26 ENCOUNTER — Other Ambulatory Visit: Payer: Self-pay | Admitting: Cardiology

## 2015-01-26 DIAGNOSIS — R079 Chest pain, unspecified: Secondary | ICD-10-CM

## 2015-02-05 ENCOUNTER — Encounter (HOSPITAL_COMMUNITY): Payer: Medicare HMO

## 2015-02-06 ENCOUNTER — Encounter (HOSPITAL_COMMUNITY): Payer: Medicare HMO

## 2015-03-01 ENCOUNTER — Ambulatory Visit (HOSPITAL_COMMUNITY): Payer: Medicare HMO | Attending: Cardiology

## 2015-03-01 ENCOUNTER — Ambulatory Visit (HOSPITAL_COMMUNITY): Payer: Medicare HMO

## 2015-03-02 ENCOUNTER — Ambulatory Visit (HOSPITAL_COMMUNITY): Admission: RE | Admit: 2015-03-02 | Payer: Medicare HMO | Source: Ambulatory Visit

## 2015-06-04 ENCOUNTER — Encounter (HOSPITAL_COMMUNITY)
Admission: RE | Admit: 2015-06-04 | Discharge: 2015-06-04 | Disposition: A | Discharge status: Home / Self Care | Payer: Medicare HMO | Source: Ambulatory Visit | Attending: Cardiology | Admitting: Cardiology

## 2015-06-04 DIAGNOSIS — R079 Chest pain, unspecified: Secondary | ICD-10-CM | POA: Insufficient documentation

## 2015-06-04 MED ORDER — TECHNETIUM TC 99M SESTAMIBI GENERIC - CARDIOLITE
30.0000 | Freq: Once | INTRAVENOUS | Status: AC | PRN
  Administered 2015-06-04: 30 via INTRAVENOUS

## 2015-06-05 ENCOUNTER — Ambulatory Visit (HOSPITAL_COMMUNITY)
Admission: RE | Admit: 2015-06-05 | Discharge: 2015-06-05 | Disposition: A | Discharge status: Home / Self Care | Payer: Medicare HMO | Source: Ambulatory Visit | Attending: Cardiology | Admitting: Cardiology

## 2015-06-05 DIAGNOSIS — R079 Chest pain, unspecified: Secondary | ICD-10-CM | POA: Diagnosis not present

## 2015-06-05 MED ORDER — REGADENOSON 0.4 MG/5ML IV SOLN
INTRAVENOUS | Status: AC
  Filled 2015-06-05: qty 5

## 2015-06-05 MED ORDER — TECHNETIUM TC 99M SESTAMIBI GENERIC - CARDIOLITE
30.0000 | Freq: Once | INTRAVENOUS | Status: AC | PRN
  Administered 2015-06-05: 30 via INTRAVENOUS

## 2015-06-05 MED ORDER — REGADENOSON 0.4 MG/5ML IV SOLN
0.4000 mg | Freq: Once | INTRAVENOUS | Status: AC
  Administered 2015-06-05: 0.4 mg via INTRAVENOUS

## 2015-06-06 LAB — NM MYOCAR MULTI W/SPECT W/WALL MOTION / EF
CHL CUP STRESS STAGE 2 SPEED: 0 mph
CHL CUP STRESS STAGE 3 GRADE: 0 %
CSEPPBP: 140 mmHg
CSEPPHR: 62 {beats}/min
Estimated workload: 1 METS
Percent of predicted max HR: 37 %
Stage 1 HR: 48 {beats}/min
Stage 2 DBP: 81 mmHg
Stage 2 Grade: 0 %
Stage 2 HR: 66 {beats}/min
Stage 2 SBP: 138 mmHg
Stage 3 DBP: 82 mmHg
Stage 3 HR: 62 {beats}/min
Stage 3 SBP: 140 mmHg
Stage 3 Speed: 0 mph

## 2016-03-03 DIAGNOSIS — M79601 Pain in right arm: Secondary | ICD-10-CM | POA: Insufficient documentation

## 2016-06-03 DIAGNOSIS — M5416 Radiculopathy, lumbar region: Secondary | ICD-10-CM | POA: Diagnosis not present

## 2016-06-03 DIAGNOSIS — M961 Postlaminectomy syndrome, not elsewhere classified: Secondary | ICD-10-CM | POA: Diagnosis not present

## 2016-06-03 DIAGNOSIS — G5601 Carpal tunnel syndrome, right upper limb: Secondary | ICD-10-CM | POA: Insufficient documentation

## 2016-06-25 DIAGNOSIS — G5601 Carpal tunnel syndrome, right upper limb: Secondary | ICD-10-CM | POA: Diagnosis not present

## 2016-06-26 DIAGNOSIS — F332 Major depressive disorder, recurrent severe without psychotic features: Secondary | ICD-10-CM | POA: Diagnosis not present

## 2016-06-26 DIAGNOSIS — F4310 Post-traumatic stress disorder, unspecified: Secondary | ICD-10-CM | POA: Insufficient documentation

## 2016-06-27 DIAGNOSIS — F332 Major depressive disorder, recurrent severe without psychotic features: Secondary | ICD-10-CM | POA: Diagnosis not present

## 2016-07-30 DIAGNOSIS — M961 Postlaminectomy syndrome, not elsewhere classified: Secondary | ICD-10-CM | POA: Diagnosis not present

## 2016-07-30 DIAGNOSIS — G5601 Carpal tunnel syndrome, right upper limb: Secondary | ICD-10-CM | POA: Diagnosis not present

## 2016-07-30 DIAGNOSIS — M5416 Radiculopathy, lumbar region: Secondary | ICD-10-CM | POA: Diagnosis not present

## 2016-10-07 DIAGNOSIS — M961 Postlaminectomy syndrome, not elsewhere classified: Secondary | ICD-10-CM | POA: Diagnosis not present

## 2016-10-07 DIAGNOSIS — G5601 Carpal tunnel syndrome, right upper limb: Secondary | ICD-10-CM | POA: Diagnosis not present

## 2016-10-07 DIAGNOSIS — Z5181 Encounter for therapeutic drug level monitoring: Secondary | ICD-10-CM | POA: Diagnosis not present

## 2016-10-07 DIAGNOSIS — M5416 Radiculopathy, lumbar region: Secondary | ICD-10-CM | POA: Diagnosis not present

## 2017-04-01 DIAGNOSIS — G5601 Carpal tunnel syndrome, right upper limb: Secondary | ICD-10-CM | POA: Diagnosis not present

## 2017-04-01 DIAGNOSIS — M961 Postlaminectomy syndrome, not elsewhere classified: Secondary | ICD-10-CM | POA: Diagnosis not present

## 2017-04-01 DIAGNOSIS — M5416 Radiculopathy, lumbar region: Secondary | ICD-10-CM | POA: Diagnosis not present

## 2017-04-21 DIAGNOSIS — I10 Essential (primary) hypertension: Secondary | ICD-10-CM | POA: Diagnosis not present

## 2017-04-21 DIAGNOSIS — H524 Presbyopia: Secondary | ICD-10-CM | POA: Diagnosis not present

## 2017-04-21 DIAGNOSIS — H25813 Combined forms of age-related cataract, bilateral: Secondary | ICD-10-CM | POA: Diagnosis not present

## 2017-04-21 DIAGNOSIS — H52223 Regular astigmatism, bilateral: Secondary | ICD-10-CM | POA: Diagnosis not present

## 2017-04-21 DIAGNOSIS — H5211 Myopia, right eye: Secondary | ICD-10-CM | POA: Diagnosis not present

## 2017-10-30 ENCOUNTER — Encounter: Payer: Self-pay | Admitting: Cardiology

## 2017-10-30 ENCOUNTER — Ambulatory Visit (INDEPENDENT_AMBULATORY_CARE_PROVIDER_SITE_OTHER): Payer: Medicare HMO | Admitting: Cardiology

## 2017-10-30 ENCOUNTER — Ambulatory Visit (HOSPITAL_BASED_OUTPATIENT_CLINIC_OR_DEPARTMENT_OTHER)
Admission: RE | Admit: 2017-10-30 | Discharge: 2017-10-30 | Disposition: A | Discharge status: Home / Self Care | Payer: Medicare HMO | Source: Ambulatory Visit | Attending: Cardiology | Admitting: Cardiology

## 2017-10-30 VITALS — BP 130/82 | HR 61 | Ht 69.0 in | Wt 353.1 lb

## 2017-10-30 DIAGNOSIS — I517 Cardiomegaly: Secondary | ICD-10-CM | POA: Diagnosis not present

## 2017-10-30 DIAGNOSIS — Z01810 Encounter for preprocedural cardiovascular examination: Secondary | ICD-10-CM | POA: Insufficient documentation

## 2017-10-30 DIAGNOSIS — I209 Angina pectoris, unspecified: Secondary | ICD-10-CM | POA: Diagnosis not present

## 2017-10-30 DIAGNOSIS — I10 Essential (primary) hypertension: Secondary | ICD-10-CM

## 2017-10-30 DIAGNOSIS — I4892 Unspecified atrial flutter: Secondary | ICD-10-CM | POA: Diagnosis not present

## 2017-10-30 MED ORDER — ASPIRIN EC 81 MG PO TBEC: 162.0000 mg | DELAYED_RELEASE_TABLET | Freq: Every day | ORAL | 3 refills | Status: AC

## 2017-10-30 NOTE — H&P (View-Only) (Signed)
Cardiology Office Note:    Date:  10/30/2017   ID:  Brett Cline, DOB 09-20-1950, MRN [id number]  PCP:  Yisroel Ramming, MD  Cardiologist:  Garwin Brothers, MD   Referring MD: Noni Saupe, MD    ASSESSMENT:    1. Angina pectoris (HCC)   2. Essential hypertension   3. Atrial flutter, unspecified type (HCC)   4. Morbid obesity (HCC)    PLAN:    In order of problems listed above:  1. Primary prevention stressed with the patient.  Importance of compliance with diet and medications stressed and he vocalized understanding. 2. I discussed with the patient atrial fibrillation, disease process. Management and therapy including rate and rhythm control, anticoagulation benefits and potential risks were discussed extensively with the patient. Patient had multiple questions which were answered to patient's satisfaction. 3. Diet was discussed for obesity and risks of obesity explained and he vocalized understanding. 4. At this point he is not keen on anticoagulation and so we will put him on 2 coated baby aspirin's on a regular basis.  Eventually will build this up to 325 mg of coated aspirin on a daily basis.  I would leave it up to his primary care physician to assess his atrial arrhythmia issues unless he wants to see me in follow-up on a long-term basis. 5. Sublingual nitroglycerin prescription was sent, its protocol and 911 protocol explained and the patient vocalized understanding questions were answered to the patient's satisfaction 6. I discussed coronary angiography and left heart catheterization with the patient at extensive length. Procedure, benefits and potential risks were explained. Patient had multiple questions which were answered to the patient's satisfaction. Patient agreed and consented for the procedure. Further recommendations will be made based on the findings of the coronary angiography. In the interim. The patient has any significant symptoms he knows to go to the  nearest emergency room.    Medication Adjustments/Labs and Tests Ordered: Current medicines are reviewed at length with the patient today.  Concerns regarding medicines are outlined above.  No orders of the defined types were placed in this encounter.  No orders of the defined types were placed in this encounter.    History of Present Illness:    Brett HAEGELE is a 67 y.o. male who is being seen today for the evaluation of angina pectoris at the request of Noni Saupe, MD.  Patient is a pleasant 67 year old male.  He has past medical history of essential hypertension.  He gives a history of atrial fibrillation/flutter for which she was on Eliquis at some point and he discontinued it himself.  He tells me that he is not keen on taking anticoagulants.  He is referred to a.  He is morbidly obese and comes in a wheelchair.  He is an ex-smoker.  The patient mentions to me that he is been having episodes of chest tightness radiating to the neck into the arm.  He denied nitroglycerin under his stomach it has helped the symptoms.  No orthopnea or PND.  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  His son is supportive and comes along with him for this visit.  Past Medical History:  Diagnosis Date  . Asthma   . BPH (benign prostatic hyperplasia)   . CHF (congestive heart failure) (HCC)   . COPD (chronic obstructive pulmonary disease) (HCC)   . Depression   . History of Roux-en-Y gastric bypass 2012  .  Hypertension   . Paroxysmal A-fib (HCC)   . Right foot drop     Past Surgical History:  Procedure Laterality Date  . 2 lumbar spine sugeries    . ent surgery     for sleep apnea  . ERCP N/A 12/26/2014   Procedure: ENDOSCOPIC RETROGRADE CHOLANGIOPANCREATOGRAPHY (ERCP);  Surgeon: Gaynelle Adu, MD;  Location: [facility];  Service: General;  Laterality: N/A;  . LAPAROSCOPIC GASTROSTOMY N/A 12/26/2014   Procedure: LAPAROSCOPIC GASTROTOMY WITH  CHOLECYSTECTOMY;  Surgeon: Gaynelle Adu, MD;  Location: [facility];  Service: General;  Laterality: N/A;  . LAPAROSCOPY N/A 12/26/2014   Procedure: LAPAROSCOPY DIAGNOSTIC;  Surgeon: Gaynelle Adu, MD;  Location: [facility];  Service: General;  Laterality: N/A;  . ROUX-EN-Y GASTRIC BYPASS      Current Medications: Current Meds  Medication Sig  . acetaminophen (TYLENOL) 325 MG tablet Do not take more than 4000 mg of Tylenol (acetaminophen) per day.  This is in your prescribed pain medicines.  You need to keep track of your total acetaminophen dose per day to protect your liver.  Marland Kitchen amitriptyline (ELAVIL) 25 MG tablet Take 25 mg by mouth 3 (three) times daily.   Marland Kitchen buPROPion (WELLBUTRIN XL) 150 MG 24 hr tablet Take 150 mg by mouth daily.   Marland Kitchen buPROPion (WELLBUTRIN XL) 300 MG 24 hr tablet Take 300 mg by mouth daily.   Marland Kitchen dicyclomine (BENTYL) 20 MG tablet Take 20 mg by mouth 4 (four) times daily -  before meals and at bedtime.   Marland Kitchen doxazosin (CARDURA) 2 MG tablet Take 2 mg by mouth daily with breakfast.   . felodipine (PLENDIL) 5 MG 24 hr tablet Take 10 mg by mouth daily.  . furosemide (LASIX) 40 MG tablet Take 40 mg by mouth daily as needed for fluid.   Marland Kitchen HYDROcodone-acetaminophen (NORCO/VICODIN) 5-325 MG per tablet Take 1-2 tablets by mouth every 4 (four) hours as needed for moderate pain or severe pain.  Marland Kitchen lisinopril (PRINIVIL,ZESTRIL) 40 MG tablet Take 40 mg by mouth daily.   . metoprolol (LOPRESSOR) 50 MG tablet Take 1 tablet (50 mg total) by mouth 2 (two) times daily.  . nitroGLYCERIN (NITROSTAT) 0.4 MG SL tablet Place 1 tablet under the tongue every 5 (five) minutes as needed.  . potassium chloride SA (K-DUR,KLOR-CON) 20 MEQ tablet Take 20 mEq by mouth 2 (two) times daily with a meal.   . SYMBICORT 160-4.5 MCG/ACT inhaler Inhale 2 puffs into the lungs 2 (two) times daily as needed (wheezing).   Marland Kitchen tiZANidine (ZANAFLEX) 4 MG tablet Take 4-8 mg by mouth every 6 (six) hours as needed for muscle spasms.   .  [DISCONTINUED] aspirin-acetaminophen-caffeine (EXCEDRIN MIGRAINE) 250-250-65 MG per tablet Take 2 tablets by mouth every 6 (six) hours as needed for migraine.  . [DISCONTINUED] azelastine (ASTELIN) 0.1 % nasal spray Place 1 spray into both nostrils daily.   . [DISCONTINUED] fluticasone (FLONASE) 50 MCG/ACT nasal spray Place 1 spray into both nostrils daily as needed for allergies.   . [DISCONTINUED] HYDROcodone-acetaminophen (NORCO) 10-325 MG per tablet Take 0.5-2 tablets by mouth every 6 (six) hours as needed (pain).   . [DISCONTINUED] LORazepam (ATIVAN) 0.5 MG tablet Take 1 tablet by mouth 30 (thirty) minutes before procedure.     Allergies:   Patient has no known allergies.   Social History   Socioeconomic History  . Marital status: Married    Spouse name: Not on file  . Number of children: Not on file  . Years of education: Not  on file  . Highest education level: Not on file  Occupational History  . Not on file  Social Needs  . Financial resource strain: Not on file  . Food insecurity:    Worry: Not on file    Inability: Not on file  . Transportation needs:    Medical: Not on file    Non-medical: Not on file  Tobacco Use  . Smoking status: Never Smoker  . Smokeless tobacco: Never Used  Substance and Sexual Activity  . Alcohol use: No    Alcohol/week: 0.0 oz  . Drug use: No  . Sexual activity: Not Currently  Lifestyle  . Physical activity:    Days per week: Not on file    Minutes per session: Not on file  . Stress: Not on file  Relationships  . Social connections:    Talks on phone: Not on file    Gets together: Not on file    Attends religious service: Not on file    Active member of club or organization: Not on file    Attends meetings of clubs or organizations: Not on file    Relationship status: Not on file  Other Topics Concern  . Not on file  Social History Narrative  . Not on file     Family History: The patient's family history includes Throat cancer  in his mother.  ROS:   Please see the history of present illness.    All other systems reviewed and are negative.  EKGs/Labs/Other Studies Reviewed:    The following studies were reviewed today: EKG reveals sinus rhythm and nonspecific ST-T changes   Recent Labs: No results found for requested labs within last 8760 hours.  Recent Lipid Panel No results found for: CHOL, TRIG, HDL, CHOLHDL, VLDL, LDLCALC, LDLDIRECT  Physical Exam:    VS:  BP 130/82 (BP Location: Right Arm, Patient Position: Sitting, Cuff Size: Normal)   Pulse 61   Ht 5\' 9"  (1.753 m)   Wt (!) 353 lb 1.9 oz (160.2 kg)   SpO2 98%   BMI 52.15 kg/m     Wt Readings from Last 3 Encounters:  10/30/17 (!) 353 lb 1.9 oz (160.2 kg)  12/29/14 (!) 312 lb (141.5 kg)  11/25/14 (!) 305 lb 1.9 oz (138.4 kg)     GEN: Patient is in no acute distress HEENT: Normal NECK: No JVD; No carotid bruits LYMPHATICS: No lymphadenopathy CARDIAC: S1 S2 regular, 2/6 systolic murmur at the apex. RESPIRATORY:  Clear to auscultation without rales, wheezing or rhonchi  ABDOMEN: Soft, non-tender, non-distended MUSCULOSKELETAL:  No edema; No deformity  SKIN: Warm and dry NEUROLOGIC:  Alert and oriented x 3 PSYCHIATRIC:  Normal affect    Signed, Garwin Brothersajan R Keena Heesch, MD  10/30/2017 2:06 PM    [facility]

## 2017-10-30 NOTE — Addendum Note (Signed)
Addended by: Lamona CurlOUTH, NICOLE H on: 10/30/2017 02:14 PM   Modules accepted: Orders

## 2017-10-30 NOTE — Patient Instructions (Signed)
Medication Instructions:  Your physician has recommended you make the following change in your medication:   START Aspirin 81mg  take two tablets daily CONTINUE: Nitroglycerin 0.4 mg sublingual (under the tongue) take every 5 minutes as needed for chest pain  When having chest pain, stop what you are doing and sit down. Take 1 nitro, wait 5 minutes. Still having chest pain, take 1 nitro, wait 5 minutes. Still having chest pain, take 1 nitro, dial 911. Total of 3 nitro in 15 minutes.    Labwork: Your physician recommends that you have the following labs drawn: CBC and BMP   Testing/Procedures: You had an EKG today  A chest x-ray takes a picture of the organs and structures inside the chest, including the heart, lungs, and blood vessels. This test can show several things, including, whether the heart is enlarges; whether fluid is building up in the lungs; and whether pacemaker / defibrillator leads are still in place.  Your physician has requested that you have a cardiac catheterization. Cardiac catheterization is used to diagnose and/or treat various heart conditions. Doctors may recommend this procedure for a number of different reasons. The most common reason is to evaluate chest pain. Chest pain can be a symptom of coronary artery disease (CAD), and cardiac catheterization can show whether plaque is narrowing or blocking your heart's arteries. This procedure is also used to evaluate the valves, as well as measure the blood flow and oxygen levels in different parts of your heart. For further information please visit [email or web address] Please follow instruction sheet, as given.    [facility] [address], [address] [place] [address] Dept: [phone] Loc: [phone]  Brett HireWilliam R Cline  10/30/2017  You are scheduled for a Cardiac Catheterization on Tuesday, June 11 with Dr. Peter SwazilandJordan.  1.  [facility]: [address] [place], [place] [address] at 5:30 AM (two hours before your procedure to ensure your preparation). Free valet parking service is available.   Special note: Every effort is made to have your procedure done on time. Please understand that emergencies sometimes delay scheduled procedures.  2. Diet: Do not eat or drink anything after midnight prior to your procedure except sips of water to take medications.  3. Labs: None needed.  4. Medication instructions in preparation for your procedure:  Stop taking, furosemide on Tuesday, June 11.    On the morning of your procedure, take your Aspirin and any morning medicines NOT listed above.  You may use sips of water.  5. Plan for one night stay--bring personal belongings. 6. Bring a current list of your medications and current insurance cards. 7. You MUST have a responsible person to drive you home. 8. Someone MUST be with you the first 24 hours after you arrive home or your discharge will be delayed. 9. Please wear clothes that are easy to get on and off and wear slip-on shoes.  Thank you for allowing us to care for you!   -- [facility]   .  Follow-Up: Your physician recommends that you schedule a follow-up appointment in: 4 weeks in [place]   Any Other Special Instructions Will Be Listed Below (If Applicable).     If you need a refill on your cardiac medications before your next appointment, please call your pharmacy.      Nitroglycerin sublingual tablets What is this  medicine? NITROGLYCERIN (nye troe GLI ser in) is a type of vasodilator. It relaxes blood vessels, increasing the blood and oxygen supply to your heart. This medicine is used to relieve chest pain caused by angina. It is also used to prevent chest pain before activities like climbing stairs, going outdoors in cold weather, or sexual  activity. This medicine may be used for other purposes; ask your health care provider or pharmacist if you have questions. COMMON BRAND NAME(S): Nitroquick, Nitrostat, Nitrotab What should I tell my health care provider before I take this medicine? They need to know if you have any of these conditions: -anemia -head injury, recent stroke, or bleeding in the brain -liver disease -previous heart attack -an unusual or allergic reaction to nitroglycerin, other medicines, foods, dyes, or preservatives -pregnant or trying to get pregnant -breast-feeding How should I use this medicine? Take this medicine by mouth as needed. At the first sign of an angina attack (chest pain or tightness) place one tablet under your tongue. You can also take this medicine 5 to 10 minutes before an event likely to produce chest pain. Follow the directions on the prescription label. Let the tablet dissolve under the tongue. Do not swallow whole. Replace the dose if you accidentally swallow it. It will help if your mouth is not dry. Saliva around the tablet will help it to dissolve more quickly. Do not eat or drink, smoke or chew tobacco while a tablet is dissolving. If you are not better within 5 minutes after taking ONE dose of nitroglycerin, call 9-1-1 immediately to seek emergency medical care. Do not take more than 3 nitroglycerin tablets over 15 minutes. If you take this medicine often to relieve symptoms of angina, your doctor or health care professional may provide you with different instructions to manage your symptoms. If symptoms do not go away after following these instructions, it is important to call 9-1-1 immediately. Do not take more than 3 nitroglycerin tablets over 15 minutes. Talk to your pediatrician regarding the use of this medicine in children. Special care may be needed. Overdosage: If you think you have taken too much of this medicine contact a poison control center or emergency room at once. NOTE: This  medicine is only for you. Do not share this medicine with others. What if I miss a dose? This does not apply. This medicine is only used as needed. What may interact with this medicine? Do not take this medicine with any of the following medications: -certain migraine medicines like ergotamine and dihydroergotamine (DHE) -medicines used to treat erectile dysfunction like sildenafil, tadalafil, and vardenafil -riociguat This medicine may also interact with the following medications: -alteplase -aspirin -heparin -medicines for high blood pressure -medicines for mental depression -other medicines used to treat angina -phenothiazines like chlorpromazine, mesoridazine, prochlorperazine, thioridazine This list may not describe all possible interactions. Give your health care provider a list of all the medicines, herbs, non-prescription drugs, or dietary supplements you use. Also tell them if you smoke, drink alcohol, or use illegal drugs. Some items may interact with your medicine. What should I watch for while using this medicine? Tell your doctor or health care professional if you feel your medicine is no longer working. Keep this medicine with you at all times. Sit or lie down when you take your medicine to prevent falling if you feel dizzy or faint after using it. Try to remain calm. This will help you to feel better faster. If you feel dizzy, take several deep breaths  and lie down with your feet propped up, or bend forward with your head resting between your knees. You may get drowsy or dizzy. Do not drive, use machinery, or do anything that needs mental alertness until you know how this drug affects you. Do not stand or sit up quickly, especially if you are an older patient. This reduces the risk of dizzy or fainting spells. Alcohol can make you more drowsy and dizzy. Avoid alcoholic drinks. Do not treat yourself for coughs, colds, or pain while you are taking this medicine without asking your  doctor or health care professional for advice. Some ingredients may increase your blood pressure. What side effects may I notice from receiving this medicine? Side effects that you should report to your doctor or health care professional as soon as possible: -blurred vision -dry mouth -skin rash -sweating -the feeling of extreme pressure in the head -unusually weak or tired Side effects that usually do not require medical attention (report to your doctor or health care professional if they continue or are bothersome): -flushing of the face or neck -headache -irregular heartbeat, palpitations -nausea, vomiting This list may not describe all possible side effects. Call your doctor for medical advice about side effects. You may report side effects to FDA at 1-800-FDA-1088. Where should I keep my medicine? Keep out of the reach of children. Store at room temperature between 20 and 25 degrees C (68 and 77 degrees F). Store in Retail buyer. Protect from light and moisture. Keep tightly closed. Throw away any unused medicine after the expiration date. NOTE: This sheet is a summary. It may not cover all possible information. If you have questions about this medicine, talk to your doctor, pharmacist, or health care provider.  2018 Elsevier/Gold Standard (2013-03-10 17:57:36)

## 2017-10-30 NOTE — Progress Notes (Signed)
Cardiology Office Note:    Date:  10/30/2017   ID:  Brett Cline, DOB 09-20-1950, MRN [id number]  PCP:  Brett Ramming, MD  Cardiologist:  Brett Brothers, MD   Referring MD: Brett Saupe, MD    ASSESSMENT:    1. Angina pectoris (HCC)   2. Essential hypertension   3. Atrial flutter, unspecified type (HCC)   4. Morbid obesity (HCC)    PLAN:    In order of problems listed above:  1. Primary prevention stressed with the patient.  Importance of compliance with diet and medications stressed and he vocalized understanding. 2. I discussed with the patient atrial fibrillation, disease process. Management and therapy including rate and rhythm control, anticoagulation benefits and potential risks were discussed extensively with the patient. Patient had multiple questions which were answered to patient's satisfaction. 3. Diet was discussed for obesity and risks of obesity explained and he vocalized understanding. 4. At this point he is not keen on anticoagulation and so we will put him on 2 coated baby aspirin's on a regular basis.  Eventually will build this up to 325 mg of coated aspirin on a daily basis.  I would leave it up to his primary care physician to assess his atrial arrhythmia issues unless he wants to see me in follow-up on a long-term basis. 5. Sublingual nitroglycerin prescription was sent, its protocol and 911 protocol explained and the patient vocalized understanding questions were answered to the patient's satisfaction 6. I discussed coronary angiography and left heart catheterization with the patient at extensive length. Procedure, benefits and potential risks were explained. Patient had multiple questions which were answered to the patient's satisfaction. Patient agreed and consented for the procedure. Further recommendations will be made based on the findings of the coronary angiography. In the interim. The patient has any significant symptoms he knows to go to the  nearest emergency room.    Medication Adjustments/Labs and Tests Ordered: Current medicines are reviewed at length with the patient today.  Concerns regarding medicines are outlined above.  No orders of the defined types were placed in this encounter.  No orders of the defined types were placed in this encounter.    History of Present Illness:    Brett Cline is a 67 y.o. male who is being seen today for the evaluation of angina pectoris at the request of Brett Saupe, MD.  Patient is a pleasant 67 year old male.  He has past medical history of essential hypertension.  He gives a history of atrial fibrillation/flutter for which she was on Eliquis at some point and he discontinued it himself.  He tells me that he is not keen on taking anticoagulants.  He is referred to a.  He is morbidly obese and comes in a wheelchair.  He is an ex-smoker.  The patient mentions to me that he is been having episodes of chest tightness radiating to the neck into the arm.  He denied nitroglycerin under his stomach it has helped the symptoms.  No orthopnea or PND.  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  His son is supportive and comes along with him for this visit.  Past Medical History:  Diagnosis Date  . Asthma   . BPH (benign prostatic hyperplasia)   . CHF (congestive heart failure) (HCC)   . COPD (chronic obstructive pulmonary disease) (HCC)   . Depression   . History of Roux-en-Y gastric bypass 2012  .  Hypertension   . Paroxysmal A-fib (HCC)   . Right foot drop     Past Surgical History:  Procedure Laterality Date  . 2 lumbar spine sugeries    . ent surgery     for sleep apnea  . ERCP N/A 12/26/2014   Procedure: ENDOSCOPIC RETROGRADE CHOLANGIOPANCREATOGRAPHY (ERCP);  Surgeon: Brett Adu, MD;  Location: [facility];  Service: General;  Laterality: N/A;  . LAPAROSCOPIC GASTROSTOMY N/A 12/26/2014   Procedure: LAPAROSCOPIC GASTROTOMY WITH  CHOLECYSTECTOMY;  Surgeon: Brett Adu, MD;  Location: [facility];  Service: General;  Laterality: N/A;  . LAPAROSCOPY N/A 12/26/2014   Procedure: LAPAROSCOPY DIAGNOSTIC;  Surgeon: Brett Adu, MD;  Location: [facility];  Service: General;  Laterality: N/A;  . ROUX-EN-Y GASTRIC BYPASS      Current Medications: Current Meds  Medication Sig  . acetaminophen (TYLENOL) 325 MG tablet Do not take more than 4000 mg of Tylenol (acetaminophen) per day.  This is in your prescribed pain medicines.  You need to keep track of your total acetaminophen dose per day to protect your liver.  Marland Kitchen amitriptyline (ELAVIL) 25 MG tablet Take 25 mg by mouth 3 (three) times daily.   Marland Kitchen buPROPion (WELLBUTRIN XL) 150 MG 24 hr tablet Take 150 mg by mouth daily.   Marland Kitchen buPROPion (WELLBUTRIN XL) 300 MG 24 hr tablet Take 300 mg by mouth daily.   Marland Kitchen dicyclomine (BENTYL) 20 MG tablet Take 20 mg by mouth 4 (four) times daily -  before meals and at bedtime.   Marland Kitchen doxazosin (CARDURA) 2 MG tablet Take 2 mg by mouth daily with breakfast.   . felodipine (PLENDIL) 5 MG 24 hr tablet Take 10 mg by mouth daily.  . furosemide (LASIX) 40 MG tablet Take 40 mg by mouth daily as needed for fluid.   Marland Kitchen HYDROcodone-acetaminophen (NORCO/VICODIN) 5-325 MG per tablet Take 1-2 tablets by mouth every 4 (four) hours as needed for moderate pain or severe pain.  Marland Kitchen lisinopril (PRINIVIL,ZESTRIL) 40 MG tablet Take 40 mg by mouth daily.   . metoprolol (LOPRESSOR) 50 MG tablet Take 1 tablet (50 mg total) by mouth 2 (two) times daily.  . nitroGLYCERIN (NITROSTAT) 0.4 MG SL tablet Place 1 tablet under the tongue every 5 (five) minutes as needed.  . potassium chloride SA (K-DUR,KLOR-CON) 20 MEQ tablet Take 20 mEq by mouth 2 (two) times daily with a meal.   . SYMBICORT 160-4.5 MCG/ACT inhaler Inhale 2 puffs into the lungs 2 (two) times daily as needed (wheezing).   Marland Kitchen tiZANidine (ZANAFLEX) 4 MG tablet Take 4-8 mg by mouth every 6 (six) hours as needed for muscle spasms.   .  [DISCONTINUED] aspirin-acetaminophen-caffeine (EXCEDRIN MIGRAINE) 250-250-65 MG per tablet Take 2 tablets by mouth every 6 (six) hours as needed for migraine.  . [DISCONTINUED] azelastine (ASTELIN) 0.1 % nasal spray Place 1 spray into both nostrils daily.   . [DISCONTINUED] fluticasone (FLONASE) 50 MCG/ACT nasal spray Place 1 spray into both nostrils daily as needed for allergies.   . [DISCONTINUED] HYDROcodone-acetaminophen (NORCO) 10-325 MG per tablet Take 0.5-2 tablets by mouth every 6 (six) hours as needed (pain).   . [DISCONTINUED] LORazepam (ATIVAN) 0.5 MG tablet Take 1 tablet by mouth 30 (thirty) minutes before procedure.     Allergies:   Patient has no known allergies.   Social History   Socioeconomic History  . Marital status: Married    Spouse name: Not on file  . Number of children: Not on file  . Years of education: Not  on file  . Highest education level: Not on file  Occupational History  . Not on file  Social Needs  . Financial resource strain: Not on file  . Food insecurity:    Worry: Not on file    Inability: Not on file  . Transportation needs:    Medical: Not on file    Non-medical: Not on file  Tobacco Use  . Smoking status: Never Smoker  . Smokeless tobacco: Never Used  Substance and Sexual Activity  . Alcohol use: No    Alcohol/week: 0.0 oz  . Drug use: No  . Sexual activity: Not Currently  Lifestyle  . Physical activity:    Days per week: Not on file    Minutes per session: Not on file  . Stress: Not on file  Relationships  . Social connections:    Talks on phone: Not on file    Gets together: Not on file    Attends religious service: Not on file    Active member of club or organization: Not on file    Attends meetings of clubs or organizations: Not on file    Relationship status: Not on file  Other Topics Concern  . Not on file  Social History Narrative  . Not on file     Family History: The patient's family history includes Throat cancer  in his mother.  ROS:   Please see the history of present illness.    All other systems reviewed and are negative.  EKGs/Labs/Other Studies Reviewed:    The following studies were reviewed today: EKG reveals sinus rhythm and nonspecific ST-T changes   Recent Labs: No results found for requested labs within last 8760 hours.  Recent Lipid Panel No results found for: CHOL, TRIG, HDL, CHOLHDL, VLDL, LDLCALC, LDLDIRECT  Physical Exam:    VS:  BP 130/82 (BP Location: Right Arm, Patient Position: Sitting, Cuff Size: Normal)   Pulse 61   Ht 5\' 9"  (1.753 m)   Wt (!) 353 lb 1.9 oz (160.2 kg)   SpO2 98%   BMI 52.15 kg/m     Wt Readings from Last 3 Encounters:  10/30/17 (!) 353 lb 1.9 oz (160.2 kg)  12/29/14 (!) 312 lb (141.5 kg)  11/25/14 (!) 305 lb 1.9 oz (138.4 kg)     GEN: Patient is in no acute distress HEENT: Normal NECK: No JVD; No carotid bruits LYMPHATICS: No lymphadenopathy CARDIAC: S1 S2 regular, 2/6 systolic murmur at the apex. RESPIRATORY:  Clear to auscultation without rales, wheezing or rhonchi  ABDOMEN: Soft, non-tender, non-distended MUSCULOSKELETAL:  No edema; No deformity  SKIN: Warm and dry NEUROLOGIC:  Alert and oriented x 3 PSYCHIATRIC:  Normal affect    Signed, Brett Brothersajan R Giorgio Chabot, MD  10/30/2017 2:06 PM    [facility]

## 2017-10-31 LAB — CBC
HEMATOCRIT: 35.8 % — AB (ref 37.5–51.0)
Hemoglobin: 11.7 g/dL — ABNORMAL LOW (ref 13.0–17.7)
MCH: 29.1 pg (ref 26.6–33.0)
MCHC: 32.7 g/dL (ref 31.5–35.7)
MCV: 89 fL (ref 79–97)
PLATELETS: 224 10*3/uL (ref 150–450)
RBC: 4.02 x10E6/uL — ABNORMAL LOW (ref 4.14–5.80)
RDW: 14.3 % (ref 12.3–15.4)
WBC: 7.8 10*3/uL (ref 3.4–10.8)

## 2017-10-31 LAB — BASIC METABOLIC PANEL
BUN/Creatinine Ratio: 10 (ref 10–24)
BUN: 8 mg/dL (ref 8–27)
CALCIUM: 7.9 mg/dL — AB (ref 8.6–10.2)
CHLORIDE: 108 mmol/L — AB (ref 96–106)
CO2: 21 mmol/L (ref 20–29)
Creatinine, Ser: 0.84 mg/dL (ref 0.76–1.27)
GFR calc Af Amer: 106 mL/min/{1.73_m2} (ref >59)
GFR calc non Af Amer: 92 mL/min/{1.73_m2} (ref >59)
GLUCOSE: 92 mg/dL (ref 65–99)
POTASSIUM: 4.4 mmol/L (ref 3.5–5.2)
Sodium: 141 mmol/L (ref 134–144)

## 2017-11-02 ENCOUNTER — Telehealth: Payer: Self-pay | Admitting: *Deleted

## 2017-11-02 NOTE — Telephone Encounter (Signed)
Pt [facility]: Tuesday November 03, 2017 7:30 AM [facility] at: 5:30 AM  No solid food after midnight prior to cath, clear liquids until 5 AM day of procedure. Verified allergies in Epic Verified no diabetes medications.  Hold: Furosemide AM of procedure KCl AM of procedure.  AM meds can be  taken pre-cath with sip of water including: ASA 81 mg  Confirmed patient has responsible person to drive home post procedure and observe patient for 24 hours: yes

## 2017-11-03 ENCOUNTER — Ambulatory Visit (HOSPITAL_COMMUNITY)
Admission: RE | Admit: 2017-11-03 | Discharge: 2017-11-03 | Disposition: A | Discharge status: Home / Self Care | Payer: Medicare HMO | Source: Ambulatory Visit | Attending: Cardiology | Admitting: Cardiology

## 2017-11-03 ENCOUNTER — Ambulatory Visit (HOSPITAL_COMMUNITY)
Admission: RE | Disposition: A | Discharge status: Home / Self Care | Payer: Self-pay | Source: Ambulatory Visit | Attending: Cardiology

## 2017-11-03 ENCOUNTER — Encounter (HOSPITAL_COMMUNITY): Payer: Self-pay | Admitting: *Deleted

## 2017-11-03 DIAGNOSIS — Z79899 Other long term (current) drug therapy: Secondary | ICD-10-CM | POA: Insufficient documentation

## 2017-11-03 DIAGNOSIS — Z87891 Personal history of nicotine dependence: Secondary | ICD-10-CM | POA: Diagnosis not present

## 2017-11-03 DIAGNOSIS — Z7982 Long term (current) use of aspirin: Secondary | ICD-10-CM | POA: Diagnosis not present

## 2017-11-03 DIAGNOSIS — J449 Chronic obstructive pulmonary disease, unspecified: Secondary | ICD-10-CM | POA: Diagnosis not present

## 2017-11-03 DIAGNOSIS — I4892 Unspecified atrial flutter: Secondary | ICD-10-CM

## 2017-11-03 DIAGNOSIS — I25119 Atherosclerotic heart disease of native coronary artery with unspecified angina pectoris: Secondary | ICD-10-CM | POA: Diagnosis not present

## 2017-11-03 DIAGNOSIS — I110 Hypertensive heart disease with heart failure: Secondary | ICD-10-CM | POA: Insufficient documentation

## 2017-11-03 DIAGNOSIS — Z9889 Other specified postprocedural states: Secondary | ICD-10-CM | POA: Diagnosis not present

## 2017-11-03 DIAGNOSIS — Z7901 Long term (current) use of anticoagulants: Secondary | ICD-10-CM | POA: Diagnosis not present

## 2017-11-03 DIAGNOSIS — N400 Enlarged prostate without lower urinary tract symptoms: Secondary | ICD-10-CM | POA: Diagnosis not present

## 2017-11-03 DIAGNOSIS — R0789 Other chest pain: Secondary | ICD-10-CM | POA: Diagnosis present

## 2017-11-03 DIAGNOSIS — I209 Angina pectoris, unspecified: Secondary | ICD-10-CM

## 2017-11-03 DIAGNOSIS — I509 Heart failure, unspecified: Secondary | ICD-10-CM | POA: Diagnosis not present

## 2017-11-03 DIAGNOSIS — F329 Major depressive disorder, single episode, unspecified: Secondary | ICD-10-CM | POA: Diagnosis not present

## 2017-11-03 DIAGNOSIS — Z6841 Body Mass Index (BMI) 40.0 and over, adult: Secondary | ICD-10-CM | POA: Diagnosis not present

## 2017-11-03 DIAGNOSIS — M21371 Foot drop, right foot: Secondary | ICD-10-CM | POA: Diagnosis not present

## 2017-11-03 DIAGNOSIS — Z9884 Bariatric surgery status: Secondary | ICD-10-CM | POA: Diagnosis not present

## 2017-11-03 DIAGNOSIS — I2510 Atherosclerotic heart disease of native coronary artery without angina pectoris: Secondary | ICD-10-CM | POA: Diagnosis not present

## 2017-11-03 DIAGNOSIS — I480 Paroxysmal atrial fibrillation: Secondary | ICD-10-CM | POA: Diagnosis not present

## 2017-11-03 DIAGNOSIS — I10 Essential (primary) hypertension: Secondary | ICD-10-CM | POA: Diagnosis present

## 2017-11-03 HISTORY — PX: LEFT HEART CATH AND CORONARY ANGIOGRAPHY: CATH118249

## 2017-11-03 SURGERY — LEFT HEART CATH AND CORONARY ANGIOGRAPHY
Anesthesia: LOCAL

## 2017-11-03 MED ORDER — ONDANSETRON HCL 4 MG/2ML IJ SOLN: 4.0000 mg | Freq: Four times a day (QID) | INTRAMUSCULAR | Status: DC | PRN

## 2017-11-03 MED ORDER — ACETAMINOPHEN 325 MG PO TABS
ORAL_TABLET | ORAL | Status: AC
  Filled 2017-11-03: qty 2

## 2017-11-03 MED ORDER — MIDAZOLAM HCL 2 MG/2ML IJ SOLN
INTRAMUSCULAR | Status: DC | PRN
  Administered 2017-11-03: 1 mg via INTRAVENOUS

## 2017-11-03 MED ORDER — SODIUM CHLORIDE 0.9 % WEIGHT BASED INFUSION: 1.0000 mL/kg/h | INTRAVENOUS | Status: DC

## 2017-11-03 MED ORDER — LIDOCAINE HCL (PF) 1 % IJ SOLN
INTRAMUSCULAR | Status: AC
  Filled 2017-11-03: qty 30

## 2017-11-03 MED ORDER — SODIUM CHLORIDE 0.9% FLUSH: 3.0000 mL | Freq: Two times a day (BID) | INTRAVENOUS | Status: DC

## 2017-11-03 MED ORDER — SODIUM CHLORIDE 0.9% FLUSH: 3.0000 mL | INTRAVENOUS | Status: DC | PRN

## 2017-11-03 MED ORDER — LIDOCAINE HCL (PF) 1 % IJ SOLN
INTRAMUSCULAR | Status: DC | PRN
  Administered 2017-11-03: 2 mL

## 2017-11-03 MED ORDER — HEPARIN SODIUM (PORCINE) 1000 UNIT/ML IJ SOLN
INTRAMUSCULAR | Status: AC
  Filled 2017-11-03: qty 1

## 2017-11-03 MED ORDER — FENTANYL CITRATE (PF) 100 MCG/2ML IJ SOLN
INTRAMUSCULAR | Status: AC
  Filled 2017-11-03: qty 2

## 2017-11-03 MED ORDER — HEPARIN SODIUM (PORCINE) 1000 UNIT/ML IJ SOLN
INTRAMUSCULAR | Status: DC | PRN
  Administered 2017-11-03: 6000 [IU] via INTRAVENOUS

## 2017-11-03 MED ORDER — SODIUM CHLORIDE 0.9 % WEIGHT BASED INFUSION
3.0000 mL/kg/h | INTRAVENOUS | Status: AC
  Administered 2017-11-03: 3 mL/kg/h via INTRAVENOUS

## 2017-11-03 MED ORDER — FENTANYL CITRATE (PF) 100 MCG/2ML IJ SOLN
INTRAMUSCULAR | Status: DC | PRN
  Administered 2017-11-03: 50 ug via INTRAVENOUS

## 2017-11-03 MED ORDER — ASPIRIN 81 MG PO CHEW: 81.0000 mg | CHEWABLE_TABLET | ORAL | Status: DC

## 2017-11-03 MED ORDER — HEPARIN (PORCINE) IN NACL 2-0.9 UNITS/ML
INTRAMUSCULAR | Status: AC | PRN
  Administered 2017-11-03 (×2): 500 mL

## 2017-11-03 MED ORDER — SODIUM CHLORIDE 0.9 % IV SOLN: 250.0000 mL | INTRAVENOUS | Status: DC | PRN

## 2017-11-03 MED ORDER — IOHEXOL 350 MG/ML SOLN
INTRAVENOUS | Status: DC | PRN
  Administered 2017-11-03: 105 mL via INTRAVENOUS

## 2017-11-03 MED ORDER — VERAPAMIL HCL 2.5 MG/ML IV SOLN
INTRAVENOUS | Status: AC
  Filled 2017-11-03: qty 2

## 2017-11-03 MED ORDER — VERAPAMIL HCL 2.5 MG/ML IV SOLN
INTRAVENOUS | Status: DC | PRN
  Administered 2017-11-03: 10 mL via INTRA_ARTERIAL

## 2017-11-03 MED ORDER — MIDAZOLAM HCL 2 MG/2ML IJ SOLN
INTRAMUSCULAR | Status: AC
  Filled 2017-11-03: qty 2

## 2017-11-03 MED ORDER — ACETAMINOPHEN 325 MG PO TABS
650.0000 mg | ORAL_TABLET | ORAL | Status: DC | PRN
  Administered 2017-11-03: 650 mg via ORAL

## 2017-11-03 MED ORDER — HEPARIN (PORCINE) IN NACL 1000-0.9 UT/500ML-% IV SOLN
INTRAVENOUS | Status: AC
  Filled 2017-11-03: qty 1000

## 2017-11-03 SURGICAL SUPPLY — 13 items
CATH 5FR JL3.5 JR4 ANG PIG MP (CATHETERS) ×2 IMPLANT
CATH INFINITI 5FR AL1 (CATHETERS) ×2 IMPLANT
CATH INFINITI 5FR JL5 (CATHETERS) ×2 IMPLANT
DEVICE RAD COMP TR BAND LRG (VASCULAR PRODUCTS) ×2 IMPLANT
GUIDEWIRE INQWIRE 1.5J.035X260 (WIRE) ×1 IMPLANT
INQWIRE 1.5J .035X260CM (WIRE) ×2
KIT HEART LEFT (KITS) ×2 IMPLANT
NEEDLE PERC 21GX4CM (NEEDLE) ×2 IMPLANT
PACK CARDIAC CATHETERIZATION (CUSTOM PROCEDURE TRAY) ×2 IMPLANT
SHEATH RAIN RADIAL 21G 6FR (SHEATH) ×2 IMPLANT
SYR MEDRAD MARK V 150ML (SYRINGE) ×2 IMPLANT
TRANSDUCER W/STOPCOCK (MISCELLANEOUS) ×2 IMPLANT
TUBING CIL FLEX 10 FLL-RA (TUBING) ×2 IMPLANT

## 2017-11-03 NOTE — Interval H&P Note (Signed)
History and Physical Interval Note:  11/03/2017 7:14 AM  Brett Cline  has presented today for surgery, with the diagnosis of angina  The various methods of treatment have been discussed with the patient and family. After consideration of risks, benefits and other options for treatment, the patient has consented to  Procedure(s): LEFT HEART CATH AND CORONARY ANGIOGRAPHY (N/A) as a surgical intervention .  The patient's history has been reviewed, patient examined, no change in status, stable for surgery.  I have reviewed the patient's chart and labs.  Questions were answered to the patient's satisfaction.   Cath Lab Visit (complete for each Cath Lab visit)  Clinical Evaluation Leading to the Procedure:   ACS: No.  Non-ACS:    Anginal Classification: [name] III  Anti-ischemic medical therapy: Maximal Therapy (2 or more classes of medications)  Non-Invasive Test Results: No non-invasive testing performed  Prior CABG: No previous CABG        Theron Aristaeter [facility] HospitalJordanMD,FACC 11/03/2017 7:15 AM

## 2017-11-03 NOTE — Discharge Instructions (Signed)

## 2017-11-04 ENCOUNTER — Encounter (HOSPITAL_COMMUNITY): Payer: Self-pay | Admitting: Cardiology

## 2017-11-04 MED FILL — Heparin Sod (Porcine)-NaCl IV Soln 1000 Unit/500ML-0.9%: INTRAVENOUS | Qty: 1000 | Status: AC

## 2017-12-07 ENCOUNTER — Ambulatory Visit: Payer: Self-pay | Admitting: Cardiology

## 2017-12-31 ENCOUNTER — Ambulatory Visit: Payer: Self-pay | Admitting: Cardiology

## 2018-02-01 ENCOUNTER — Ambulatory Visit: Payer: Self-pay | Admitting: Cardiology

## 2018-02-17 ENCOUNTER — Ambulatory Visit: Payer: Self-pay | Admitting: Cardiology

## 2018-03-05 ENCOUNTER — Ambulatory Visit: Payer: Self-pay | Admitting: Cardiology

## 2018-03-30 ENCOUNTER — Ambulatory Visit: Payer: Self-pay | Admitting: Cardiology

## 2018-04-15 ENCOUNTER — Ambulatory Visit: Payer: Self-pay | Admitting: Cardiology

## 2018-06-23 ENCOUNTER — Ambulatory Visit (INDEPENDENT_AMBULATORY_CARE_PROVIDER_SITE_OTHER): Payer: Medicare Other | Admitting: Cardiology

## 2018-06-23 ENCOUNTER — Encounter: Payer: Self-pay | Admitting: Cardiology

## 2018-06-23 VITALS — BP 160/82 | HR 100 | Ht 69.0 in | Wt 367.0 lb

## 2018-06-23 DIAGNOSIS — I10 Essential (primary) hypertension: Secondary | ICD-10-CM

## 2018-06-23 DIAGNOSIS — G4733 Obstructive sleep apnea (adult) (pediatric): Secondary | ICD-10-CM | POA: Diagnosis not present

## 2018-06-23 DIAGNOSIS — I2510 Atherosclerotic heart disease of native coronary artery without angina pectoris: Secondary | ICD-10-CM | POA: Diagnosis not present

## 2018-06-23 NOTE — Progress Notes (Signed)
Cardiology Office Note:    Date:  06/23/2018   ID:  Brett Cline, DOB 02/21/1951, MRN [id number]  PCP:  Yisroel RammingVollmer, Kelly, MD  Cardiologist:  Garwin Brothersajan R Revankar, MD   Referring MD: Yisroel RammingVollmer, Kelly, MD    ASSESSMENT:    1. Essential hypertension   2. Obstructive apnea   3. Morbid obesity (HCC)   4. Coronary artery disease involving native coronary artery of native heart without angina pectoris    PLAN:    In order of problems listed above:  1. Prevention stressed with the patient.  Importance of compliance with diet and medication stressed and he vocalized understanding.  Diet was discussed for dyslipidemia and obesity and risks of obesity explained and he vocalized understanding.  His blood pressure is elevated I have asked him to keep a track of his home blood pressures as he needs statin therapy and this will be a care physician. 2. Patient will be seen in follow-up appointment on an annual basis or earlier if he has any concerns.  He is now directed back to his primary care physician for addressing prevention needs such as blood pressure and lipid issues.  He will be seen in follow-up appointment on an annual basis.   Medication Adjustments/Labs and Tests Ordered: Current medicines are reviewed at length with the patient today.  Concerns regarding medicines are outlined above.  No orders of the defined types were placed in this encounter.  No orders of the defined types were placed in this encounter.    No chief complaint on file.    History of Present Illness:    Brett Cline is a 68 y.o. male.  Evaluated by me for chest pain suggesting of angina underwent coronary angiography which revealed nonobstructive disease.  Subsequently is done fine.  He has no chest pain orthopnea or PND.  At the time of my evaluation, the patient is alert awake oriented and in no distress.  Past Medical History:  Diagnosis Date  . Asthma   . BPH (benign prostatic hyperplasia)   . CHF  (congestive heart failure) (HCC)   . COPD (chronic obstructive pulmonary disease) (HCC)   . Depression   . History of Roux-en-Y gastric bypass 2012  . Hypertension   . Paroxysmal A-fib (HCC)   . Right foot drop     Past Surgical History:  Procedure Laterality Date  . 2 lumbar spine sugeries    . ent surgery     for sleep apnea  . ERCP N/A 12/26/2014   Procedure: ENDOSCOPIC RETROGRADE CHOLANGIOPANCREATOGRAPHY (ERCP);  Surgeon: Gaynelle AduEric Wilson, MD;  Location: [facility];  Service: General;  Laterality: N/A;  . LAPAROSCOPIC GASTROSTOMY N/A 12/26/2014   Procedure: LAPAROSCOPIC GASTROTOMY WITH CHOLECYSTECTOMY;  Surgeon: Gaynelle AduEric Wilson, MD;  Location: [facility];  Service: General;  Laterality: N/A;  . LAPAROSCOPY N/A 12/26/2014   Procedure: LAPAROSCOPY DIAGNOSTIC;  Surgeon: Gaynelle AduEric Wilson, MD;  Location: [facility];  Service: General;  Laterality: N/A;  . LEFT HEART CATH AND CORONARY ANGIOGRAPHY N/A 11/03/2017   Procedure: LEFT HEART CATH AND CORONARY ANGIOGRAPHY;  Surgeon: SwazilandJordan, Peter M, MD;  [facility];  Service: Cardiovascular;  Laterality: N/A;  . ROUX-EN-Y GASTRIC BYPASS      Current Medications: Current Meds  Medication Sig  . acetaminophen (TYLENOL) 325 MG tablet Do not take more than 4000 mg of Tylenol (acetaminophen) per day.  This is in your prescribed pain medicines.  You need to keep track of your total acetaminophen dose per day  to protect your liver.  Marland Kitchen amitriptyline (ELAVIL) 25 MG tablet Take 25 mg by mouth 2 (two) times daily.   Marland Kitchen aspirin EC 81 MG tablet Take 2 tablets (162 mg total) by mouth daily.  Marland Kitchen buPROPion (WELLBUTRIN XL) 150 MG 24 hr tablet Take 150 mg by mouth daily.   Marland Kitchen buPROPion (WELLBUTRIN XL) 300 MG 24 hr tablet Take 300 mg by mouth daily.   Marland Kitchen dicyclomine (BENTYL) 20 MG tablet Take 20 mg by mouth 4 (four) times daily -  before meals and at bedtime.   Marland Kitchen doxazosin (CARDURA) 2 MG tablet Take 2 mg by mouth at bedtime.   . felodipine (PLENDIL) 5 MG 24 hr tablet Take 10 mg  by mouth daily.  . furosemide (LASIX) 40 MG tablet Take 40 mg by mouth daily as needed for fluid.   Marland Kitchen levothyroxine (SYNTHROID, LEVOTHROID) 50 MCG tablet Take 1 tablet by mouth daily.  Marland Kitchen lisinopril (PRINIVIL,ZESTRIL) 40 MG tablet Take 40 mg by mouth daily.   . metoprolol (LOPRESSOR) 50 MG tablet Take 1 tablet (50 mg total) by mouth 2 (two) times daily. (Patient taking differently: Take 100 mg by mouth 2 (two) times daily. )  . nitroGLYCERIN (NITROSTAT) 0.4 MG SL tablet Place 1 tablet under the tongue every 5 (five) minutes as needed.  . potassium chloride SA (K-DUR,KLOR-CON) 20 MEQ tablet Take 20 mEq by mouth 2 (two) times daily with a meal.   . SYMBICORT 160-4.5 MCG/ACT inhaler Inhale 2 puffs into the lungs daily as needed (wheezing).   Marland Kitchen tiZANidine (ZANAFLEX) 4 MG tablet Take 4 mg by mouth 3 (three) times daily.      Allergies:   Patient has no known allergies.   Social History   Socioeconomic History  . Marital status: Widowed    Spouse name: Not on file  . Number of children: Not on file  . Years of education: Not on file  . Highest education level: Not on file  Occupational History  . Not on file  Social Needs  . Financial resource strain: Not on file  . Food insecurity:    Worry: Not on file    Inability: Not on file  . Transportation needs:    Medical: Not on file    Non-medical: Not on file  Tobacco Use  . Smoking status: Never Smoker  . Smokeless tobacco: Never Used  Substance and Sexual Activity  . Alcohol use: No    Alcohol/week: 0.0 standard drinks  . Drug use: No  . Sexual activity: Not Currently  Lifestyle  . Physical activity:    Days per week: Not on file    Minutes per session: Not on file  . Stress: Not on file  Relationships  . Social connections:    Talks on phone: Not on file    Gets together: Not on file    Attends religious service: Not on file    Active member of club or organization: Not on file    Attends meetings of clubs or organizations:  Not on file    Relationship status: Not on file  Other Topics Concern  . Not on file  Social History Narrative  . Not on file     Family History: The patient's family history includes Throat cancer in his mother.  ROS:   Please see the history of present illness.    All other systems reviewed and are negative.  EKGs/Labs/Other Studies Reviewed:    The following studies were reviewed today: I discussed  my findings with the patient at extensive length.   Recent Labs: 10/30/2017: BUN 8; Creatinine, Ser 0.84; Hemoglobin 11.7; Platelets 224; Potassium 4.4; Sodium 141  Recent Lipid Panel No results found for: CHOL, TRIG, HDL, CHOLHDL, VLDL, LDLCALC, LDLDIRECT  Physical Exam:    VS:  BP (!) 160/82 (BP Location: Right Arm, Patient Position: Sitting, Cuff Size: Normal)   Pulse 100   Ht 5\' 9"  (1.753 m)   Wt (!) 367 lb (166.5 kg)   SpO2 98%   BMI 54.20 kg/m     Wt Readings from Last 3 Encounters:  06/23/18 (!) 367 lb (166.5 kg)  11/03/17 (!) 350 lb (158.8 kg)  10/30/17 (!) 353 lb 1.9 oz (160.2 kg)     GEN: Patient is in no acute distress HEENT: Normal NECK: No JVD; No carotid bruits LYMPHATICS: No lymphadenopathy CARDIAC: Hear sounds regular, 2/6 systolic murmur at the apex. RESPIRATORY:  Clear to auscultation without rales, wheezing or rhonchi  ABDOMEN: Soft, non-tender, non-distended MUSCULOSKELETAL:  No edema; No deformity  SKIN: Warm and dry NEUROLOGIC:  Alert and oriented x 3 PSYCHIATRIC:  Normal affect   Signed, Garwin Brothers, MD  06/23/2018 11:17 AM    [facility]

## 2018-06-23 NOTE — Patient Instructions (Signed)
Medication Instructions:  Your physician recommends that you continue on your current medications as directed. Please refer to the Current Medication list given to you today.  If you need a refill on your cardiac medications before your next appointment, please call your pharmacy.   Lab work: None  If you have labs (blood work) drawn today and your tests are completely normal, you will receive your results only by: . MyChart Message (if you have MyChart) OR . A paper copy in the mail If you have any lab test that is abnormal or we need to change your treatment, we will call you to review the results.  Testing/Procedures: None  Follow-Up: At [facility], you and your health needs are our priority.  As part of our continuing mission to provide you with exceptional heart care, we have created designated Provider Care Teams.  These Care Teams include your primary Cardiologist (physician) and Advanced Practice Providers (APPs -  Physician Assistants and Nurse Practitioners) who all work together to provide you with the care you need, when you need it. You will need a follow up appointment in 1 years.  Please call our office 2 months in advance to schedule this appointment.  You may see No primary care provider on file. or another member of our [facility] Provider Team in [place]: Robert Krasowski, MD . Brian Munley, MD  Any Other Special Instructions Will Be Listed Below (If Applicable).   

## 2019-06-10 ENCOUNTER — Ambulatory Visit: Payer: Medicare Other | Admitting: Cardiology

## 2019-06-18 ENCOUNTER — Other Ambulatory Visit: Payer: Self-pay | Admitting: General Surgery

## 2019-06-18 DIAGNOSIS — R1900 Intra-abdominal and pelvic swelling, mass and lump, unspecified site: Secondary | ICD-10-CM

## 2019-07-01 ENCOUNTER — Ambulatory Visit: Payer: Medicare Other | Admitting: Cardiology

## 2019-07-06 ENCOUNTER — Ambulatory Visit: Payer: Medicare Other | Admitting: Cardiology

## 2019-07-27 ENCOUNTER — Ambulatory Visit (HOSPITAL_COMMUNITY): Admission: RE | Admit: 2019-07-27 | Payer: Medicare Other | Source: Ambulatory Visit

## 2019-07-29 ENCOUNTER — Ambulatory Visit: Payer: Medicare Other | Admitting: Cardiology

## 2019-08-04 ENCOUNTER — Ambulatory Visit (HOSPITAL_COMMUNITY): Payer: Medicare Other

## 2019-08-05 ENCOUNTER — Ambulatory Visit (HOSPITAL_COMMUNITY): Payer: Medicare Other

## 2019-08-09 ENCOUNTER — Other Ambulatory Visit (HOSPITAL_COMMUNITY): Payer: Self-pay | Admitting: General Surgery

## 2019-08-09 DIAGNOSIS — R1900 Intra-abdominal and pelvic swelling, mass and lump, unspecified site: Secondary | ICD-10-CM

## 2019-08-12 ENCOUNTER — Ambulatory Visit (HOSPITAL_COMMUNITY): Payer: Medicare Other

## 2019-08-22 ENCOUNTER — Other Ambulatory Visit: Payer: Self-pay

## 2019-08-22 ENCOUNTER — Ambulatory Visit (HOSPITAL_COMMUNITY)
Admission: RE | Admit: 2019-08-22 | Discharge: 2019-08-22 | Disposition: A | Discharge status: Home / Self Care | Payer: Medicare Other | Source: Ambulatory Visit | Attending: General Surgery | Admitting: General Surgery

## 2019-08-22 DIAGNOSIS — R1900 Intra-abdominal and pelvic swelling, mass and lump, unspecified site: Secondary | ICD-10-CM | POA: Diagnosis not present

## 2019-08-22 LAB — POCT I-STAT CREATININE: Creatinine, Ser: 1 mg/dL (ref 0.61–1.24)

## 2019-08-22 MED ORDER — SODIUM CHLORIDE (PF) 0.9 % IJ SOLN
INTRAMUSCULAR | Status: DC
Start: 2019-08-22 — End: 2019-08-23
  Filled 2019-08-22: qty 50

## 2019-08-22 MED ORDER — IOHEXOL 300 MG/ML  SOLN
100.0000 mL | Freq: Once | INTRAMUSCULAR | Status: AC | PRN
  Administered 2019-08-22: 100 mL via INTRAVENOUS

## 2019-09-06 ENCOUNTER — Other Ambulatory Visit: Payer: Self-pay | Admitting: General Surgery

## 2019-09-06 DIAGNOSIS — K862 Cyst of pancreas: Secondary | ICD-10-CM

## 2019-10-07 ENCOUNTER — Other Ambulatory Visit: Payer: Medicare Other

## 2019-10-17 ENCOUNTER — Encounter: Payer: Self-pay | Admitting: Gastroenterology

## 2019-11-09 ENCOUNTER — Telehealth: Payer: Self-pay | Admitting: *Deleted

## 2019-11-09 ENCOUNTER — Encounter: Payer: Self-pay | Admitting: Gastroenterology

## 2019-11-09 NOTE — Telephone Encounter (Signed)
Yes, he should be scheduled for OV first to discuss the pancreatic lesion noted on CT.   Also, he is scheduled for Colo with me on 7/9 in [facility]. That can be cancelled.

## 2019-11-09 NOTE — Telephone Encounter (Signed)
Dr.Cirigliano,  This patient is referred to [name] for a screening colonoscopy, PCP OV notes sent to [name] states to see CT scan results from 08/22/2019. Patient is also for MRI for cystic mass pancreas. He had an OV with Dr.Gupta in 2015. Patient is in a wheelchair but can stand with assistance per chart. Last BMI=54.17 on 06/23/2018 and current OV notes states patient's weight is up to 388 lbs. Okay for direct screening colonoscopy at [facility] or office visit first? Please advise. Thank you, Nazariah Cadet PV

## 2019-11-09 NOTE — Telephone Encounter (Signed)
Patient called back and made OV appointment for 8/18 with Dr.Cirigliano.

## 2019-11-09 NOTE — Telephone Encounter (Signed)
Called patient's #. Patient's son answered the phone, he took down our number and will have the patient give [name] a call back to schedule an OV with Dr.Cirigliano.   Colon and PV appointments cancelled.

## 2019-12-02 ENCOUNTER — Encounter: Payer: Medicare Other | Admitting: Gastroenterology

## 2020-01-11 ENCOUNTER — Ambulatory Visit: Payer: Medicare Other | Admitting: Gastroenterology

## 2020-10-11 IMAGING — CT CT ABD-PELV W/ CM
2 of 8 series · 15 of 46 positions shown, 17 images · IV contrast (APPLIED)
Comparison: 12/21/2014

CLINICAL DATA: Abdominal wall bulge for several months. Prior
gastric bypass. Mid abdominal pain and constipation.

EXAM:
CT ABDOMEN AND PELVIS WITH CONTRAST
TECHNIQUE: Multidetector CT imaging of the abdomen and pelvis was performed
using the standard protocol following bolus administration of
intravenous contrast.
CONTRAST:  100mL OMNIPAQUE IOHEXOL 300 MG/ML  SOLN

[Series 3: axial st · axial · 0.98mm/px · z∈[-484,-84]mm · 12 of 94 slices shown, 14 images]
[im 7/94  soft-tissue]
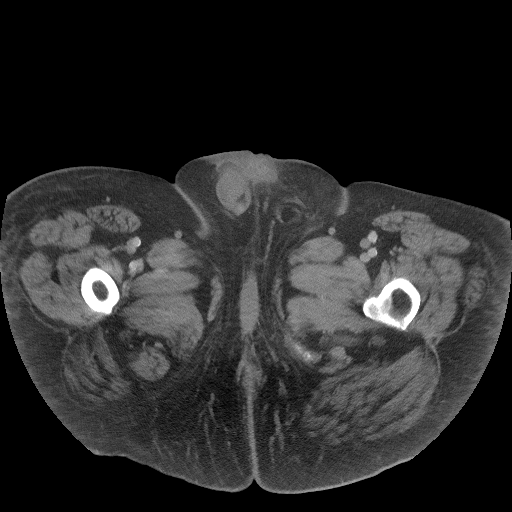
[im 7/94  bone]
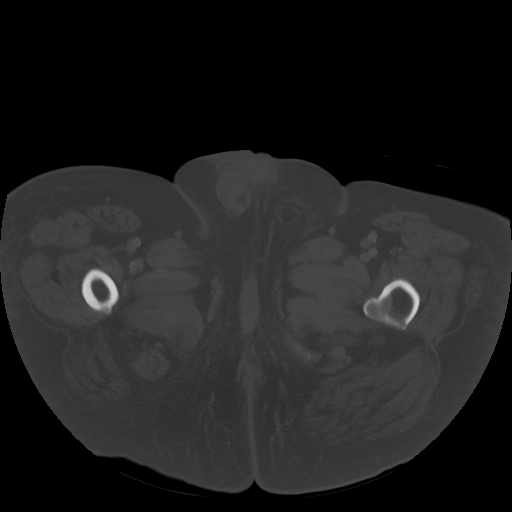
[im 13/94  soft-tissue]
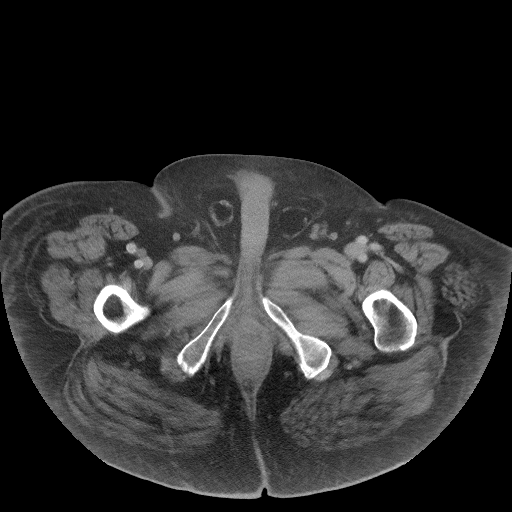
[im 19/94  soft-tissue]
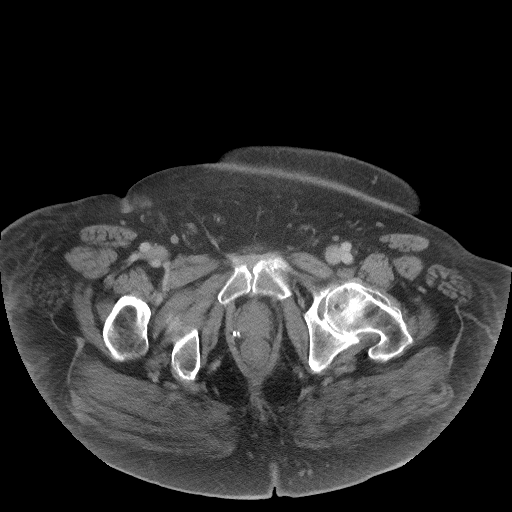
[im 32/94  soft-tissue]
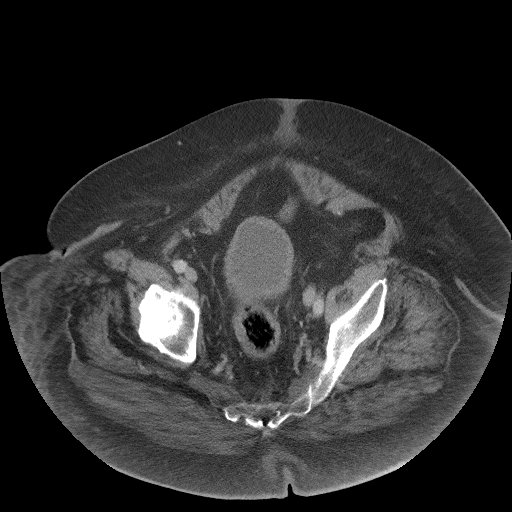
[im 38/94  soft-tissue]
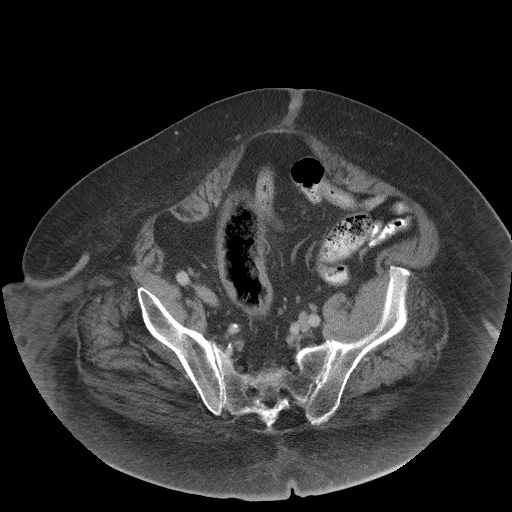
[im 44/94  soft-tissue]
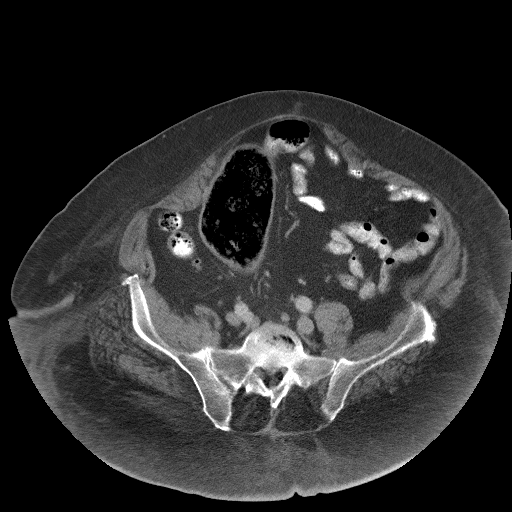
[im 50/94  soft-tissue]
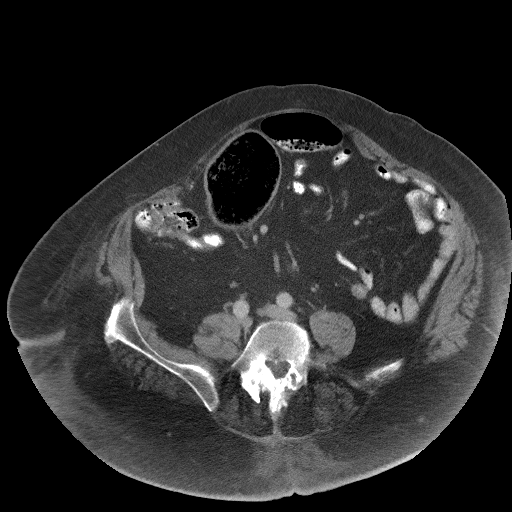
[im 56/94  soft-tissue]
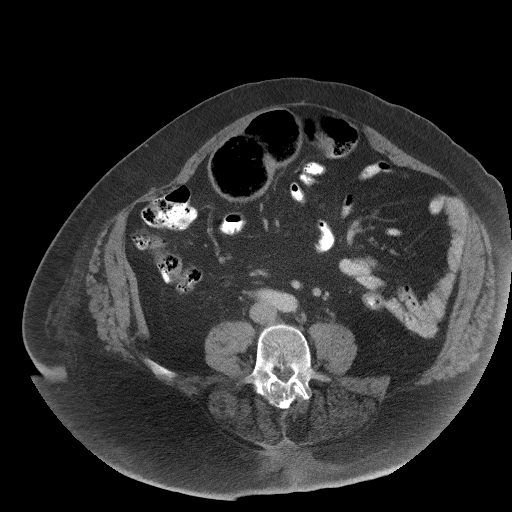
[im 63/94  soft-tissue]
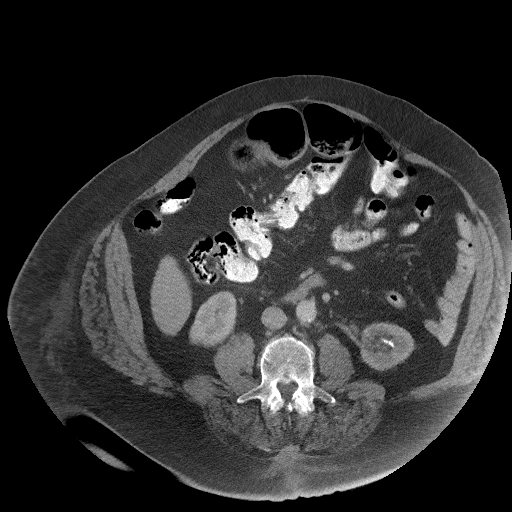
[im 63/94  bone]
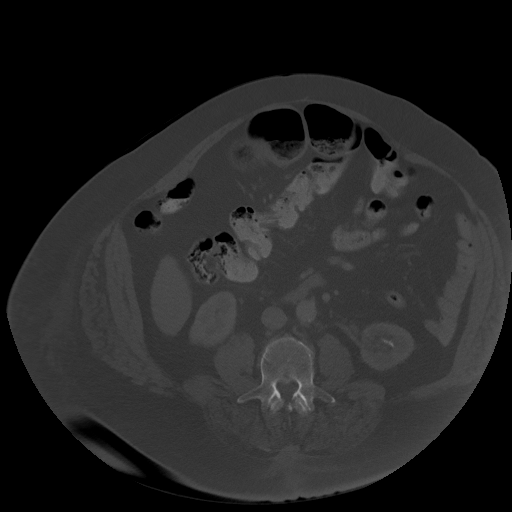
[im 75/94  soft-tissue]
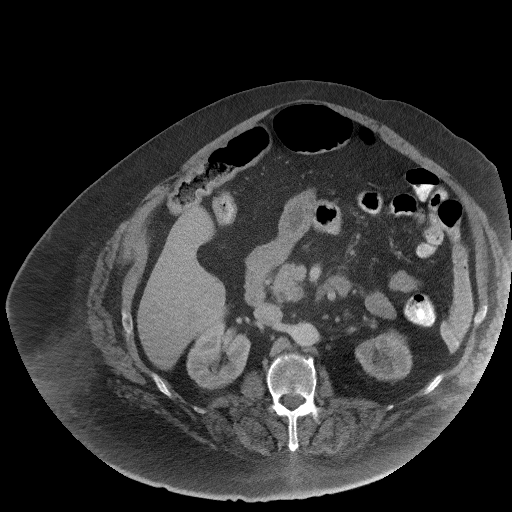
[im 81/94  soft-tissue]
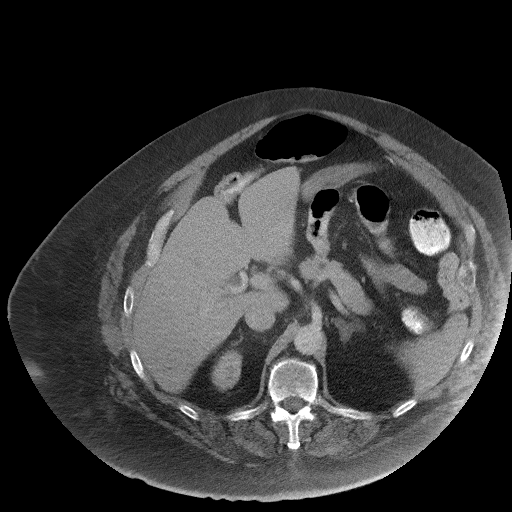
[im 87/94  soft-tissue]
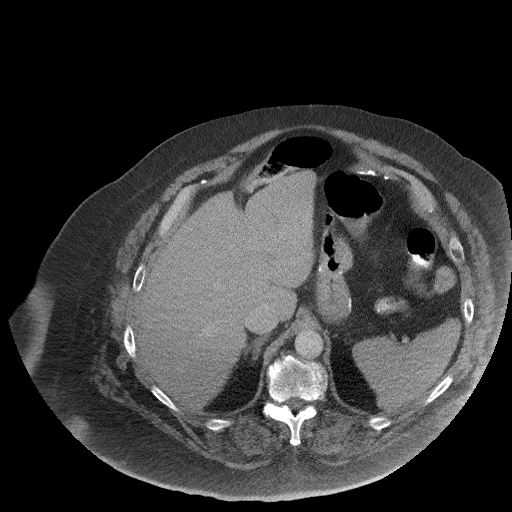

[Series 9: coronal st · coronal · 0.94mm/px · 3 of 136 slices shown]
[im 34/136  soft-tissue]
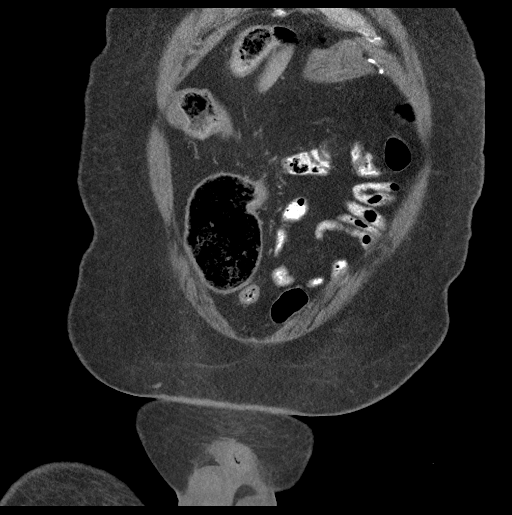
[im 68/136  soft-tissue]
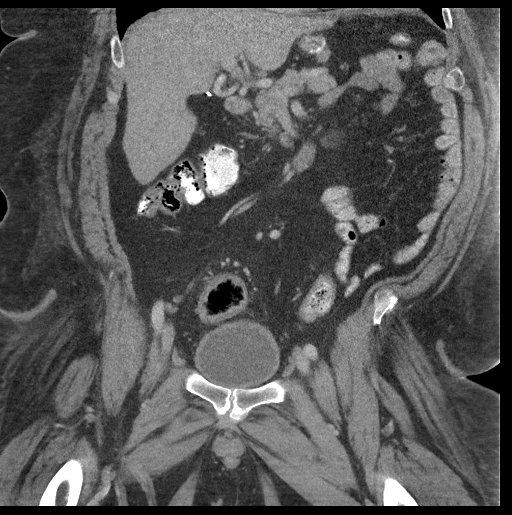
[im 102/136  soft-tissue]
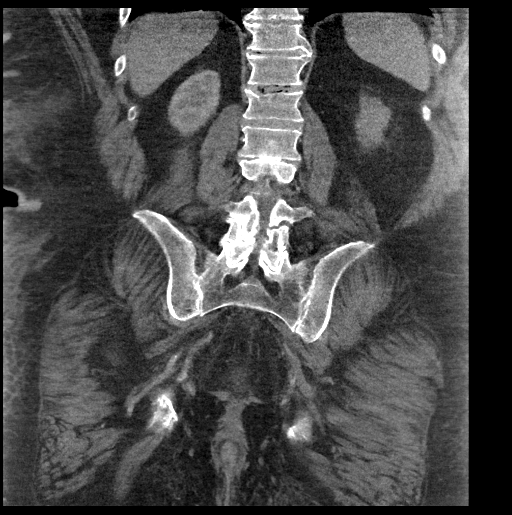

[15 of 46 positions shown; findings below may reference images not displayed]

FINDINGS: Body habitus reduces diagnostic sensitivity and specificity.

Lower chest: Left anterior descending and right coronary artery
atherosclerotic calcification with mild cardiomegaly.

Hepatobiliary: Cholecystectomy. Otherwise unremarkable.

Pancreas: 1.5 by 0.6 by 0.8 cm hypodense lesion noted along the
upper margin of the pancreatic body on image 72/9, probably a cystic
lesion. Definite connectivity with the dorsal pancreatic duct is not
shown. The pancreas appears otherwise normal.

Spleen: Unremarkable

Adrenals/Urinary Tract: Low-density fullness of the adrenal glands,
left greater than right, without a well-defined mass.

5 mm in long axis nonobstructive right kidney lower pole renal
calculus, image 54/9. Possible 1-2 mm right kidney upper pole
nonobstructive renal calculus on image 37/9. Normal appearance of
the right ureter.

Clustered stones in the collecting system and extending down into
the left kidney lower pole are shown on image 47/9, with a least 10
stones outlining this region, the largest is in the collecting
system and measures 1.3 cm in diameter on image 40 [DATE]. There is
stranding around the left renal collecting system and several
additional nonobstructive left mid kidney calculi are present. There
is fullness of the left renal collecting system but without
well-defined infundibular or calyceal dilatation. No left
hydroureter or left ureteral calculus seen. The urinary bladder
appears unremarkable.

There is subtle hypoenhancement of the left kidney compared to the
right.

Stomach/Bowel: Gastric bypass without complicating feature. Very
redundant sigmoid colon extending up into the right upper quadrant.
The distal sigmoid colon is mildly dilated at 6.7 cm in diameter,
filled with stool material, and borderline thick walled. No findings
of sigmoid volvulus at this time. The wall thickening in the sigmoid
colon extends down into the rectum. Overall the rest of the colon
does not appear dilated. Subtle infiltrative stranding in the
perirectal space.

Vascular/Lymphatic: Aortoiliac atherosclerotic vascular disease.
Right external iliac node 1.2 cm in diameter on image 68/3, formerly
1.1 cm on 12/21/2014.

Reproductive: Unremarkable

Other: Mild subcutaneous edema along the flanks and posterior back.

Musculoskeletal: Severe right and moderate to severe left
degenerative hip arthropathy. Considerable lumbar spondylosis and
degenerative disc disease resulting in multilevel impingement most
striking at L4-5 at L5-S1. Fatty spermatic cords bilateral.
IMPRESSION: 1. Very redundant sigmoid colon extending up into the right upper
quadrant. The distal sigmoid colon is mildly dilated at 6.7 cm in
diameter, filled with stool material, and mildly thick walled. This
wall thickening extends into the rectum and there is faint
perirectal stranding. This raises the possibility of a mild distal
colitis. No findings of sigmoid volvulus at this time.
2. Considerable nephrolithiasis in the left kidney lower pole and
collecting system with stranding around the left renal collecting
system likely reflecting local irritation from the stones. Although
there is no overt left hydroureter or caliceal blunting, there is
equivocal hypoenhancement of the left kidney compared to the right,
and I would suggest urine analysis to exclude urinary tract
infection/pyelonephritis.
3. 1.5 by 0.6 by 0.8 cm hypodense lesion along the upper margin of
the pancreatic body. A neoplastic lesion is not excluded, and
pancreatic protocol MRI with and without contrast is recommended for
further characterization.
4. Coronary atherosclerosis with mild cardiomegaly.
5. Severe right and moderate to severe left degenerative hip
arthropathy.
6. Lumbar spondylosis and degenerative disc disease resulting in
multilevel impingement.
7. Mild subcutaneous edema along the flanks and posterior back.

Aortic Atherosclerosis ([id number]).

## 2020-11-26 DIAGNOSIS — I340 Nonrheumatic mitral (valve) insufficiency: Secondary | ICD-10-CM
